# Patient Record
Sex: Female | Born: 1945 | Race: Black or African American | Hispanic: No | Marital: Married | Smoking: Never smoker
Health system: Southern US, Community
[De-identification: ages and names within clinical notes are randomized; demographics above are authoritative.]

## PROBLEM LIST (undated history)

## (undated) DIAGNOSIS — I10 Essential (primary) hypertension: Secondary | ICD-10-CM

## (undated) DIAGNOSIS — N390 Urinary tract infection, site not specified: Secondary | ICD-10-CM

## (undated) DIAGNOSIS — E7800 Pure hypercholesterolemia, unspecified: Secondary | ICD-10-CM

## (undated) DIAGNOSIS — E0590 Thyrotoxicosis, unspecified without thyrotoxic crisis or storm: Secondary | ICD-10-CM

## (undated) HISTORY — DX: Urinary tract infection, site not specified: N39.0

## (undated) HISTORY — PX: ABDOMINAL HYSTERECTOMY: SHX81

---

## 1973-10-24 HISTORY — PX: BREAST EXCISIONAL BIOPSY: SUR124

## 2003-10-25 HISTORY — PX: BREAST EXCISIONAL BIOPSY: SUR124

## 2004-09-24 ENCOUNTER — Ambulatory Visit: Payer: Self-pay | Admitting: General Surgery

## 2005-05-27 ENCOUNTER — Ambulatory Visit: Payer: Self-pay | Admitting: General Surgery

## 2007-06-29 ENCOUNTER — Ambulatory Visit: Payer: Self-pay | Admitting: Internal Medicine

## 2008-09-26 ENCOUNTER — Ambulatory Visit: Payer: Self-pay | Admitting: Internal Medicine

## 2009-08-07 ENCOUNTER — Ambulatory Visit: Payer: Self-pay | Admitting: Internal Medicine

## 2010-08-27 ENCOUNTER — Ambulatory Visit: Payer: Self-pay | Admitting: Internal Medicine

## 2011-10-11 ENCOUNTER — Ambulatory Visit: Payer: Self-pay | Admitting: Internal Medicine

## 2012-11-14 ENCOUNTER — Ambulatory Visit: Payer: Self-pay | Admitting: Internal Medicine

## 2014-01-27 ENCOUNTER — Ambulatory Visit: Payer: Self-pay | Admitting: Internal Medicine

## 2015-03-03 ENCOUNTER — Other Ambulatory Visit: Payer: Self-pay | Admitting: Internal Medicine

## 2015-03-03 DIAGNOSIS — Z1231 Encounter for screening mammogram for malignant neoplasm of breast: Secondary | ICD-10-CM

## 2015-03-11 ENCOUNTER — Ambulatory Visit
Admission: RE | Admit: 2015-03-11 | Discharge: 2015-03-11 | Disposition: A | Discharge status: Home / Self Care | Payer: 59 | Source: Ambulatory Visit | Attending: Internal Medicine | Admitting: Internal Medicine

## 2015-03-11 DIAGNOSIS — Z1231 Encounter for screening mammogram for malignant neoplasm of breast: Secondary | ICD-10-CM | POA: Diagnosis present

## 2016-03-14 ENCOUNTER — Other Ambulatory Visit: Payer: Self-pay | Admitting: Internal Medicine

## 2016-03-14 DIAGNOSIS — Z1231 Encounter for screening mammogram for malignant neoplasm of breast: Secondary | ICD-10-CM

## 2016-03-17 ENCOUNTER — Other Ambulatory Visit: Payer: Self-pay | Admitting: Internal Medicine

## 2016-03-17 ENCOUNTER — Ambulatory Visit
Admission: RE | Admit: 2016-03-17 | Discharge: 2016-03-17 | Disposition: A | Discharge status: Home / Self Care | Payer: 59 | Source: Ambulatory Visit | Attending: Internal Medicine | Admitting: Internal Medicine

## 2016-03-17 DIAGNOSIS — Z1231 Encounter for screening mammogram for malignant neoplasm of breast: Secondary | ICD-10-CM | POA: Insufficient documentation

## 2017-07-24 ENCOUNTER — Other Ambulatory Visit: Payer: Self-pay | Admitting: Internal Medicine

## 2017-07-24 DIAGNOSIS — Z1231 Encounter for screening mammogram for malignant neoplasm of breast: Secondary | ICD-10-CM

## 2017-08-03 ENCOUNTER — Ambulatory Visit
Admission: RE | Admit: 2017-08-03 | Discharge: 2017-08-03 | Disposition: A | Discharge status: Home / Self Care | Payer: Managed Care, Other (non HMO) | Source: Ambulatory Visit | Attending: Internal Medicine | Admitting: Internal Medicine

## 2017-08-03 DIAGNOSIS — Z1231 Encounter for screening mammogram for malignant neoplasm of breast: Secondary | ICD-10-CM

## 2018-03-23 ENCOUNTER — Other Ambulatory Visit: Payer: Self-pay | Admitting: Internal Medicine

## 2018-03-23 DIAGNOSIS — Z1231 Encounter for screening mammogram for malignant neoplasm of breast: Secondary | ICD-10-CM

## 2018-10-15 ENCOUNTER — Other Ambulatory Visit: Payer: Self-pay | Admitting: Internal Medicine

## 2018-10-15 DIAGNOSIS — Z1231 Encounter for screening mammogram for malignant neoplasm of breast: Secondary | ICD-10-CM

## 2018-10-22 ENCOUNTER — Ambulatory Visit
Admission: RE | Admit: 2018-10-22 | Discharge: 2018-10-22 | Disposition: A | Discharge status: Home / Self Care | Payer: Managed Care, Other (non HMO) | Source: Ambulatory Visit | Attending: Internal Medicine | Admitting: Internal Medicine

## 2018-10-22 DIAGNOSIS — Z1231 Encounter for screening mammogram for malignant neoplasm of breast: Secondary | ICD-10-CM | POA: Insufficient documentation

## 2019-01-10 ENCOUNTER — Encounter: Payer: Self-pay | Admitting: Emergency Medicine

## 2019-01-10 ENCOUNTER — Emergency Department
Admission: EM | Admit: 2019-01-10 | Discharge: 2019-01-10 | Disposition: A | Discharge status: Home / Self Care | Payer: No Typology Code available for payment source | Attending: Emergency Medicine | Admitting: Emergency Medicine

## 2019-01-10 ENCOUNTER — Other Ambulatory Visit: Payer: Self-pay

## 2019-01-10 DIAGNOSIS — Y9389 Activity, other specified: Secondary | ICD-10-CM | POA: Diagnosis not present

## 2019-01-10 DIAGNOSIS — S40022A Contusion of left upper arm, initial encounter: Secondary | ICD-10-CM | POA: Diagnosis not present

## 2019-01-10 DIAGNOSIS — S161XXA Strain of muscle, fascia and tendon at neck level, initial encounter: Secondary | ICD-10-CM | POA: Diagnosis not present

## 2019-01-10 DIAGNOSIS — S199XXA Unspecified injury of neck, initial encounter: Secondary | ICD-10-CM | POA: Diagnosis present

## 2019-01-10 DIAGNOSIS — Z79899 Other long term (current) drug therapy: Secondary | ICD-10-CM | POA: Diagnosis not present

## 2019-01-10 DIAGNOSIS — Y999 Unspecified external cause status: Secondary | ICD-10-CM | POA: Insufficient documentation

## 2019-01-10 DIAGNOSIS — Y92410 Unspecified street and highway as the place of occurrence of the external cause: Secondary | ICD-10-CM | POA: Insufficient documentation

## 2019-01-10 MED ORDER — KETOROLAC TROMETHAMINE 30 MG/ML IJ SOLN
15.0000 mg | Freq: Once | INTRAMUSCULAR | Status: AC
  Administered 2019-01-10: 15 mg via INTRAMUSCULAR
  Filled 2019-01-10: qty 1

## 2019-01-10 MED ORDER — METHOCARBAMOL 500 MG PO TABS: 500.0000 mg | ORAL_TABLET | Freq: Four times a day (QID) | ORAL | 0 refills | Status: DC

## 2019-01-10 MED ORDER — ORPHENADRINE CITRATE 30 MG/ML IJ SOLN
60.0000 mg | Freq: Once | INTRAMUSCULAR | Status: AC
  Administered 2019-01-10: 60 mg via INTRAMUSCULAR
  Filled 2019-01-10: qty 2

## 2019-01-10 MED ORDER — MELOXICAM 7.5 MG PO TABS: 7.5000 mg | ORAL_TABLET | Freq: Every day | ORAL | 0 refills | Status: DC
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
## 2019-08-02 ENCOUNTER — Other Ambulatory Visit: Payer: Self-pay

## 2019-08-02 DIAGNOSIS — Z20822 Contact with and (suspected) exposure to covid-19: Secondary | ICD-10-CM

## 2019-08-04 LAB — NOVEL CORONAVIRUS, NAA: SARS-CoV-2, NAA: NOT DETECTED

## 2019-08-19 ENCOUNTER — Other Ambulatory Visit: Payer: Self-pay | Admitting: Internal Medicine

## 2019-08-19 DIAGNOSIS — Z1231 Encounter for screening mammogram for malignant neoplasm of breast: Secondary | ICD-10-CM

## 2019-10-15 ENCOUNTER — Other Ambulatory Visit: Payer: Self-pay

## 2019-10-15 ENCOUNTER — Emergency Department
Admission: EM | Admit: 2019-10-15 | Discharge: 2019-10-15 | Disposition: A | Discharge status: Home / Self Care | Payer: Managed Care, Other (non HMO) | Attending: Emergency Medicine | Admitting: Emergency Medicine

## 2019-10-15 DIAGNOSIS — J0100 Acute maxillary sinusitis, unspecified: Secondary | ICD-10-CM | POA: Insufficient documentation

## 2019-10-15 DIAGNOSIS — U071 COVID-19: Secondary | ICD-10-CM | POA: Insufficient documentation

## 2019-10-15 DIAGNOSIS — Z79899 Other long term (current) drug therapy: Secondary | ICD-10-CM | POA: Insufficient documentation

## 2019-10-15 DIAGNOSIS — M7918 Myalgia, other site: Secondary | ICD-10-CM | POA: Insufficient documentation

## 2019-10-15 DIAGNOSIS — J111 Influenza due to unidentified influenza virus with other respiratory manifestations: Secondary | ICD-10-CM | POA: Diagnosis not present

## 2019-10-15 DIAGNOSIS — R519 Headache, unspecified: Secondary | ICD-10-CM | POA: Diagnosis present

## 2019-10-15 LAB — SARS CORONAVIRUS 2 (TAT 6-24 HRS): SARS Coronavirus 2: POSITIVE — AB

## 2019-10-15 MED ORDER — FEXOFENADINE-PSEUDOEPHED ER 60-120 MG PO TB12
1.0000 | ORAL_TABLET | Freq: Two times a day (BID) | ORAL | 0 refills | Status: AC
Start: 2019-10-15 — End: ?

## 2019-10-15 MED ORDER — AMOXICILLIN 125 MG/5ML PO SUSR: 125.0000 mg | Freq: Three times a day (TID) | ORAL | 0 refills | Status: DC

## 2019-10-15 MED ORDER — TRAMADOL HCL 50 MG PO TABS: 50.0000 mg | ORAL_TABLET | Freq: Two times a day (BID) | ORAL | 0 refills | Status: DC | PRN

## 2019-10-15 MED ORDER — KETOROLAC TROMETHAMINE 30 MG/ML IJ SOLN
30.0000 mg | Freq: Once | INTRAMUSCULAR | Status: AC
  Administered 2019-10-15: 08:00:00 30 mg via INTRAMUSCULAR
  Filled 2019-10-15: qty 1

## 2019-10-15 NOTE — ED Notes (Signed)
See triage note  Presents with some head congestion and h/a  States temp of 99 yesterday at home  Afebrile on arrival   States she has been around her grand child that tested positive for COVID
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
## 2019-10-15 NOTE — Discharge Instructions (Signed)
Advised self quarantine pending results of COVID-19 test.

## 2019-10-15 NOTE — ED Provider Notes (Signed)
[facility] Emergency Department Provider Note   ____________________________________________   First MD Initiated Contact with Patient 10/15/19 [phone]     (approximate)  I have reviewed the triage vital signs and the nursing notes.   HISTORY  Chief Complaint Headache and Generalized Body Aches    HPI Bethany Austin is a 73 y.o. female patient complaining headache generalized body aches sinus congestion and facial pain.  Onset of complaint beginning yesterday.  Patient has been exposed to Covid 19+ close family member.  Patient denies recent travel.  Patient has taken flu shot for this season.         History reviewed. No pertinent past medical history.  There are no problems to display for this patient.   Past Surgical History:  Procedure Laterality Date  . BREAST EXCISIONAL BIOPSY Right 1975   negative  . BREAST EXCISIONAL BIOPSY Right 2005   negative    Prior to Admission medications   Medication Sig Start Date End Date Taking? Authorizing Provider  amoxicillin (AMOXIL) 125 MG/5ML suspension Take 5 mLs (125 mg total) by mouth 3 (three) times daily. 10/15/19   Sable Feil, PA-C  atorvastatin (LIPITOR) 10 MG tablet Take 10 mg by mouth daily.    [provider]  fexofenadine-pseudoephedrine (ALLEGRA-D) 60-120 MG 12 hr tablet Take 1 tablet by mouth 2 (two) times daily. 10/15/19   Sable Feil, PA-C  levothyroxine (SYNTHROID, LEVOTHROID) 50 MCG tablet Take 50 mcg by mouth daily before breakfast.    [provider]  lisinopril-hydrochlorothiazide (PRINZIDE,ZESTORETIC) 10-12.5 MG tablet Take 1 tablet by mouth daily.    [provider]  traMADol (ULTRAM) 50 MG tablet Take 1 tablet (50 mg total) by mouth every 12 (twelve) hours as needed. 10/15/19   Sable Feil, PA-C    Allergies Patient has no known allergies.  Family History  Problem Relation Age of Onset  . Breast cancer Neg Hx     Social  History Social History   Tobacco Use  . Smoking status: Never Smoker  . Smokeless tobacco: Never Used  Substance Use Topics  . Alcohol use: Never  . Drug use: Never    Review of Systems Constitutional: No fever/chills Eyes: No visual changes. ENT: No sore throat.  Nasal congestion. Cardiovascular: Denies chest pain. Respiratory: Denies shortness of breath. Gastrointestinal: No abdominal pain.  No nausea, no vomiting.  No diarrhea.  No constipation. Genitourinary: Negative for dysuria. Musculoskeletal: Negative for back pain. Skin: Negative for rash. Neurological: N positive for headaches, but denies focal weakness or numbness. Endocrine:  Hyperlipidemia, hypertension, hypothyroidism.  ____________________________________________   PHYSICAL EXAM:  VITAL SIGNS: ED Triage Vitals  Enc Vitals Group     BP 10/15/19 0559 (!) 158/83     Pulse Rate 10/15/19 0559 (!) 106     Resp 10/15/19 0559 17     Temp 10/15/19 0559 98.3 F (36.8 C)     Temp Source 10/15/19 0559 Oral     SpO2 10/15/19 0559 100 %     Weight 10/15/19 0558 199 lb 15.3 oz (90.7 kg)     Height 10/15/19 0559 5\' 2"  (1.575 m)     Head Circumference --      Peak Flow --      Pain Score 10/15/19 0557 5     Pain Loc --      Pain Edu? --      Excl. in [place]? --     Constitutional: Alert and oriented. Well appearing  and in no acute distress. Eyes: Conjunctivae are normal. PERRL. EOMI. Head: Atraumatic. Nose: Edematous nasal turbinates with thick rhinorrhea. Mouth/Throat: Mucous membranes are moist.  Oropharynx non-erythematous. Neck: No stridor.  Hematological/Lymphatic/Immunilogical: No cervical lymphadenopathy. Cardiovascular: Normal rate, regular rhythm. Grossly normal heart sounds.  Good peripheral circulation. Respiratory: Normal respiratory effort.  No retractions. Lungs CTAB. Gastrointestinal: Soft and nontender. No distention. No abdominal bruits. No CVA tenderness. Neurologic:  Normal speech and language.  No gross focal neurologic deficits are appreciated. No gait instability. Skin:  Skin is warm, dry and intact. No rash noted. Psychiatric: Mood and affect are normal. Speech and behavior are normal.  ____________________________________________   LABS (all labs ordered are listed, but only abnormal results are displayed)  Labs Reviewed  SARS CORONAVIRUS 2 (TAT 6-24 HRS)   ____________________________________________  EKG   ____________________________________________  RADIOLOGY  ED MD interpretation:    Official radiology report(s): No results found.  ____________________________________________   PROCEDURES  Procedure(s) performed (including Critical Care):  Procedures   ____________________________________________   INITIAL IMPRESSION / ASSESSMENT AND PLAN / ED COURSE  As part of my medical decision making, I reviewed the following data within the [place]     Patient presents with generalized body aches, fatigue, and headache.  Patient also had complaint of sinus congestion and facial pain.  Physical exam is consistent with subacute maxillary sinusitis.  Patient given discharge care instruction advised self quarantine pending results of COVID-19 test.  [facility].   Bethany Austin was evaluated in Emergency Department on 10/15/2019 for the symptoms described in the history of present illness. She was evaluated in the context of the global COVID-19 pandemic, which necessitated consideration that the patient might be at risk for infection with the SARS-CoV-2 virus that causes COVID-19. Institutional protocols and algorithms that pertain to the evaluation of patients at risk for COVID-19 are in a state of rapid change based on information released by regulatory bodies including the CDC and federal and state organizations. These policies and algorithms were followed during the patient's care in the ED.        ____________________________________________   FINAL CLINICAL IMPRESSION(S) / ED DIAGNOSES  Final diagnoses:  Subacute maxillary sinusitis  Influenza-like illness     ED Discharge Orders         Ordered    amoxicillin (AMOXIL) 125 MG/5ML suspension  3 times daily     10/15/19 0746    fexofenadine-pseudoephedrine (ALLEGRA-D) 60-120 MG 12 hr tablet  2 times daily     10/15/19 0746    traMADol (ULTRAM) 50 MG tablet  Every 12 hours PRN     10/15/19 0746           Note:  This document was prepared using Dragon voice recognition software and may include unintentional dictation errors.    Sable Feil, PA-C 10/15/19 [id number]    Lavonia Drafts, MD 10/15/19 1016

## 2019-10-16 ENCOUNTER — Telehealth: Payer: Self-pay | Admitting: Unknown Physician Specialty

## 2019-10-16 ENCOUNTER — Telehealth: Payer: Self-pay | Admitting: Emergency Medicine

## 2019-10-16 NOTE — Telephone Encounter (Signed)
Called patient to inform of positive covid result.  Discussed isolation and quarantine guidelines.  She will notify her employer and she will call to reschedule mammogram and stress test.

## 2019-10-16 NOTE — Telephone Encounter (Signed)
Symptom onset yesterday.  Discussed with patient about Covid symptoms and the use of bamlanivimab, a monoclonal antibody infusion for those with mild to moderate Covid symptoms and at a high risk of hospitalization.  Pt is qualified for this infusion at the [facility] infusion center due to Age > 74   She will talk to husband and I will call back tomorrow

## 2019-10-17 ENCOUNTER — Telehealth: Payer: Self-pay | Admitting: Unknown Physician Specialty

## 2019-10-17 NOTE — Telephone Encounter (Signed)
Unable to leave message.  Mychart message left.

## 2019-10-17 NOTE — Telephone Encounter (Signed)
Patient called back and refused mab treatment for Covid 19

## 2019-10-30 ENCOUNTER — Ambulatory Visit: Payer: Medicare Other | Attending: Internal Medicine

## 2019-10-30 DIAGNOSIS — Z20822 Contact with and (suspected) exposure to covid-19: Secondary | ICD-10-CM

## 2019-10-31 LAB — NOVEL CORONAVIRUS, NAA: SARS-CoV-2, NAA: NOT DETECTED

## 2019-11-20 ENCOUNTER — Ambulatory Visit
Admission: RE | Admit: 2019-11-20 | Discharge: 2019-11-20 | Disposition: A | Discharge status: Home / Self Care | Payer: Managed Care, Other (non HMO) | Source: Ambulatory Visit | Attending: Internal Medicine | Admitting: Internal Medicine

## 2019-11-20 DIAGNOSIS — Z1231 Encounter for screening mammogram for malignant neoplasm of breast: Secondary | ICD-10-CM | POA: Insufficient documentation

## 2020-11-23 ENCOUNTER — Other Ambulatory Visit: Payer: Self-pay | Admitting: Internal Medicine

## 2020-11-23 DIAGNOSIS — Z1231 Encounter for screening mammogram for malignant neoplasm of breast: Secondary | ICD-10-CM

## 2020-12-03 ENCOUNTER — Telehealth (INDEPENDENT_AMBULATORY_CARE_PROVIDER_SITE_OTHER): Payer: Self-pay | Admitting: Gastroenterology

## 2020-12-03 ENCOUNTER — Other Ambulatory Visit: Payer: Self-pay

## 2020-12-03 DIAGNOSIS — Z1211 Encounter for screening for malignant neoplasm of colon: Secondary | ICD-10-CM

## 2020-12-03 MED ORDER — PEG 3350-KCL-NA BICARB-NACL 420 G PO SOLR: 4000.0000 mL | Freq: Once | ORAL | 0 refills | Status: AC

## 2020-12-03 NOTE — Progress Notes (Signed)
Gastroenterology Pre-Procedure Review  Request Date: 01/06/21 Requesting Physician: Dr. Bonna Gains  PATIENT REVIEW QUESTIONS: The patient responded to the following health history questions as indicated:    1. Are you having any GI issues? no 2. Do you have a personal history of Polyps? no 3. Do you have a family history of Colon Cancer or Polyps? no 4. Diabetes Mellitus? no 5. Joint replacements in the past 12 months?no 6. Major health problems in the past 3 months?no 7. Any artificial heart valves, MVP, or defibrillator?no    MEDICATIONS & ALLERGIES:    Patient reports the following regarding taking any anticoagulation/antiplatelet therapy:   Plavix, Coumadin, Eliquis, Xarelto, Lovenox, Pradaxa, Brilinta, or Effient? no Aspirin? yes (81 mg daily)  Patient confirms/reports the following medications:  Current Outpatient Medications  Medication Sig Dispense Refill  . atorvastatin (LIPITOR) 10 MG tablet Take 10 mg by mouth daily.    Marland Kitchen levothyroxine (SYNTHROID, LEVOTHROID) 50 MCG tablet Take 50 mcg by mouth daily before breakfast.    . lisinopril-hydrochlorothiazide (PRINZIDE,ZESTORETIC) 10-12.5 MG tablet Take 1 tablet by mouth daily.    . polyethylene glycol-electrolytes (NULYTELY) 420 g solution Take 4,000 mLs by mouth once for 1 dose. At 5pm fill nulytely bowel prep to the fill line with a clear liquid. Mix well.  Drink 8oz every 30 minutes until entire contents have been completed.  Do not eat or drink anything 4 hours prior to procedure. 4000 mL 0  . amoxicillin (AMOXIL) 125 MG/5ML suspension Take 5 mLs (125 mg total) by mouth 3 (three) times daily. (Patient not taking: Reported on 12/03/2020) 150 mL 0  . aspirin 81 MG chewable tablet Chew by mouth.    . fexofenadine-pseudoephedrine (ALLEGRA-D) 60-120 MG 12 hr tablet Take 1 tablet by mouth 2 (two) times daily. (Patient not taking: Reported on 12/03/2020) 20 tablet 0  . traMADol (ULTRAM) 50 MG tablet Take 1 tablet (50 mg total) by mouth  every 12 (twelve) hours as needed. (Patient not taking: Reported on 12/03/2020) 12 tablet 0   No current facility-administered medications for this visit.    Patient confirms/reports the following allergies:  No Known Allergies  No orders of the defined types were placed in this encounter.   AUTHORIZATION INFORMATION Primary Insurance: 1D#: Group #:  Secondary Insurance: 1D#: Group #:  SCHEDULE INFORMATION: Date: 01/06/21 Time: Location:[facility]

## 2021-01-04 ENCOUNTER — Other Ambulatory Visit: Payer: Self-pay

## 2021-01-04 ENCOUNTER — Other Ambulatory Visit
Admission: RE | Admit: 2021-01-04 | Discharge: 2021-01-04 | Disposition: A | Discharge status: Home / Self Care | Payer: Medicare Other | Source: Ambulatory Visit | Attending: Gastroenterology | Admitting: Gastroenterology

## 2021-01-04 DIAGNOSIS — Z20822 Contact with and (suspected) exposure to covid-19: Secondary | ICD-10-CM | POA: Insufficient documentation

## 2021-01-04 DIAGNOSIS — Z01812 Encounter for preprocedural laboratory examination: Secondary | ICD-10-CM | POA: Insufficient documentation

## 2021-01-04 LAB — SARS CORONAVIRUS 2 (TAT 6-24 HRS): SARS Coronavirus 2: NEGATIVE

## 2021-01-05 ENCOUNTER — Encounter: Payer: Self-pay | Admitting: Gastroenterology

## 2021-01-06 ENCOUNTER — Ambulatory Visit: Payer: Medicare Other | Admitting: Anesthesiology

## 2021-01-06 ENCOUNTER — Other Ambulatory Visit: Payer: Self-pay

## 2021-01-06 ENCOUNTER — Encounter: Payer: Self-pay | Admitting: Gastroenterology

## 2021-01-06 ENCOUNTER — Ambulatory Visit
Admission: RE | Admit: 2021-01-06 | Discharge: 2021-01-06 | Disposition: A | Discharge status: Home / Self Care | Payer: Medicare Other | Attending: Gastroenterology | Admitting: Gastroenterology

## 2021-01-06 ENCOUNTER — Encounter
Admission: RE | Disposition: A | Discharge status: Home / Self Care | Payer: Self-pay | Source: Home / Self Care | Attending: Gastroenterology

## 2021-01-06 DIAGNOSIS — K635 Polyp of colon: Secondary | ICD-10-CM | POA: Diagnosis not present

## 2021-01-06 DIAGNOSIS — Z79899 Other long term (current) drug therapy: Secondary | ICD-10-CM | POA: Insufficient documentation

## 2021-01-06 DIAGNOSIS — I10 Essential (primary) hypertension: Secondary | ICD-10-CM | POA: Diagnosis not present

## 2021-01-06 DIAGNOSIS — D125 Benign neoplasm of sigmoid colon: Secondary | ICD-10-CM | POA: Diagnosis not present

## 2021-01-06 DIAGNOSIS — Z1211 Encounter for screening for malignant neoplasm of colon: Secondary | ICD-10-CM | POA: Diagnosis present

## 2021-01-06 DIAGNOSIS — K648 Other hemorrhoids: Secondary | ICD-10-CM | POA: Insufficient documentation

## 2021-01-06 DIAGNOSIS — Z7982 Long term (current) use of aspirin: Secondary | ICD-10-CM | POA: Diagnosis not present

## 2021-01-06 HISTORY — PX: COLONOSCOPY WITH PROPOFOL: SHX5780

## 2021-01-06 HISTORY — DX: Essential (primary) hypertension: I10

## 2021-01-06 HISTORY — DX: Thyrotoxicosis, unspecified without thyrotoxic crisis or storm: E05.90

## 2021-01-06 HISTORY — DX: Pure hypercholesterolemia, unspecified: E78.00

## 2021-01-06 SURGERY — COLONOSCOPY WITH PROPOFOL
Anesthesia: General

## 2021-01-06 MED ORDER — PROPOFOL 500 MG/50ML IV EMUL
INTRAVENOUS | Status: AC
  Filled 2021-01-06: qty 50

## 2021-01-06 MED ORDER — SODIUM CHLORIDE 0.9 % IV SOLN: INTRAVENOUS | Status: DC

## 2021-01-06 MED ORDER — LIDOCAINE HCL (CARDIAC) PF 100 MG/5ML IV SOSY
PREFILLED_SYRINGE | INTRAVENOUS | Status: DC | PRN
  Administered 2021-01-06: 40 mg via INTRAVENOUS

## 2021-01-06 MED ORDER — PROPOFOL 500 MG/50ML IV EMUL
INTRAVENOUS | Status: DC | PRN
  Administered 2021-01-06: 150 ug/kg/min via INTRAVENOUS

## 2021-01-06 MED ORDER — PROPOFOL 10 MG/ML IV BOLUS
INTRAVENOUS | Status: DC | PRN
  Administered 2021-01-06: 70 mg via INTRAVENOUS

## 2021-01-06 NOTE — Transfer of Care (Signed)
Immediate Anesthesia Transfer of Care Note  Patient: Bethany Austin  Procedure(s) Performed: Procedure(s): COLONOSCOPY WITH PROPOFOL (N/A)  Patient Location: PACU and Endoscopy Unit  Anesthesia Type:General  Level of Consciousness: sedated  Airway & Oxygen Therapy: Patient Spontanous Breathing and Patient connected to nasal cannula oxygen  Post-op Assessment: Report given to RN and Post -op Vital signs reviewed and stable  Post vital signs: Reviewed and stable  Last Vitals:  Vitals:   01/06/21 0717 01/06/21 0833  BP: (!) 169/83 103/68  Pulse: 85   Resp: 18   Temp: (!) 35.8 C (!) 35.9 C  SpO2: 784%     Complications: No apparent anesthesia complications

## 2021-01-06 NOTE — Anesthesia Preprocedure Evaluation (Signed)
Anesthesia Evaluation  Patient identified by MRN, date of birth, ID band Patient awake    Reviewed: Allergy & Precautions, H&P , NPO status , Patient's Chart, lab work & pertinent test results, reviewed documented beta blocker date and time   Airway Mallampati: II   Neck ROM: full    Dental  (+) Poor Dentition   Pulmonary neg pulmonary ROS,    Pulmonary exam normal        Cardiovascular negative cardio ROS Normal cardiovascular exam Rhythm:regular Rate:Normal     Neuro/Psych negative neurological ROS  negative psych ROS   GI/Hepatic negative GI ROS, Neg liver ROS,   Endo/Other  negative endocrine ROS  Renal/GU negative Renal ROS  negative genitourinary   Musculoskeletal   Abdominal   Peds  Hematology negative hematology ROS (+)   Anesthesia Other Findings History reviewed. No pertinent past medical history. Past Surgical History: No date: ABDOMINAL HYSTERECTOMY 1975: BREAST EXCISIONAL BIOPSY; Right     Comment:  negative 2005: BREAST EXCISIONAL BIOPSY; Right     Comment:  negative   Reproductive/Obstetrics negative OB ROS                             Anesthesia Physical Anesthesia Plan  ASA: II  Anesthesia Plan: General   Post-op Pain Management:    Induction:   PONV Risk Score and Plan:   Airway Management Planned:   Additional Equipment:   Intra-op Plan:   Post-operative Plan:   Informed Consent: I have reviewed the patients History and Physical, chart, labs and discussed the procedure including the risks, benefits and alternatives for the proposed anesthesia with the patient or authorized representative who has indicated his/her understanding and acceptance.     Dental Advisory Given  Plan Discussed with: CRNA  Anesthesia Plan Comments:         Anesthesia Quick Evaluation

## 2021-01-06 NOTE — Op Note (Signed)
[facility] Gastroenterology Patient Name: Shalece Staffa Procedure Date: 01/06/2021 7:53 AM MRN: [id number] Account #: [id number] Date of Birth: [birth year] Admit Type: Outpatient Age: 75 Room: [facility] ENDO ROOM 3 Gender: Female Note Status: Finalized Procedure:             Colonoscopy Indications:           Screening for colorectal malignant neoplasm Providers:             Nanetta Wiegman B. Bonna Gains MD, MD Referring MD:          Forest Gleason Md, MD (Referring MD) Medicines:             Monitored Anesthesia Care Complications:         No immediate complications. Procedure:             Pre-Anesthesia Assessment:                        - ASA Grade Assessment: II - A patient with mild                         systemic disease.                        - Prior to the procedure, a History and Physical was                         performed, and patient medications, allergies and                         sensitivities were reviewed. The patient's tolerance                         of previous anesthesia was reviewed.                        - The risks and benefits of the procedure and the                         sedation options and risks were discussed with the                         patient. All questions were answered and informed                         consent was obtained.                        - Patient identification and proposed procedure were                         verified prior to the procedure by the physician, the                         nurse, the anesthesiologist, the anesthetist and the                         technician. The procedure was verified in the                         procedure  room.                        After obtaining informed consent, the colonoscope was                         passed under direct vision. Throughout the procedure,                         the patient's blood pressure, pulse, and oxygen                         saturations were  monitored continuously. The                         Colonoscope was introduced through the anus and                         advanced to the the cecum, identified by appendiceal                         orifice and ileocecal valve. The colonoscopy was                         performed with ease. The patient tolerated the                         procedure well. The quality of the bowel preparation                         was good. Findings:      The perianal and digital rectal examinations were normal.      A 6 mm polyp was found in the sigmoid colon. The polyp was sessile. The       polyp was removed with a cold snare. Resection and retrieval were       complete.      The exam was otherwise without abnormality.      The rectum, sigmoid colon, descending colon, transverse colon, ascending       colon and cecum appeared normal.      Non-bleeding internal hemorrhoids were found during retroflexion. Impression:            - One 6 mm polyp in the sigmoid colon, removed with a                         cold snare. Resected and retrieved.                        - The examination was otherwise normal.                        - The rectum, sigmoid colon, descending colon,                         transverse colon, ascending colon and cecum are normal.                        - Non-bleeding internal hemorrhoids. Recommendation:        - Discharge patient to home (with escort).                        -  Advance diet as tolerated.                        - Continue present medications.                        - Await pathology results.                        - Repeat colonoscopy date to be determined after                         pending pathology results are reviewed.                        - The findings and recommendations were discussed with                         the patient.                        - The findings and recommendations were discussed with                         the patient's family.                         - Return to primary care physician as previously                         scheduled.                        - High fiber diet. Procedure Code(s):     --- Professional ---                        [phone], Colonoscopy, flexible; with removal of                         tumor(s), polyp(s), or other lesion(s) by snare                         technique Diagnosis Code(s):     --- Professional ---                        Z12.11, Encounter for screening for malignant neoplasm                         of colon                        K63.5, Polyp of colon CPT copyright 2019 American Medical Association. All rights reserved. The codes documented in this report are preliminary and upon coder review may  be revised to meet current compliance requirements.  Vonda Antigua, MD Margretta Sidle B. Bonna Gains MD, MD 01/06/2021 10:36:15 AM This report has been signed electronically. Number of Addenda: 0 Note Initiated On: 01/06/2021 7:53 AM Scope Withdrawal Time: 0 hours 15 minutes 23 seconds  Total Procedure Duration: 0 hours 21 minutes 39 seconds  Estimated Blood Loss:  Estimated blood loss: none.      [facility]

## 2021-01-06 NOTE — Anesthesia Procedure Notes (Signed)
Date/Time: 01/06/2021 8:06 AM Performed by: Doreen Salvage, CRNA Pre-anesthesia Checklist: Patient identified, Emergency Drugs available, Suction available and Patient being monitored Patient Re-evaluated:Patient Re-evaluated prior to induction Oxygen Delivery Method: Nasal cannula Induction Type: IV induction Dental Injury: Teeth and Oropharynx as per pre-operative assessment  Comments: Nasal cannula with etCO2 monitoring

## 2021-01-06 NOTE — H&P (Signed)
Vonda Antigua, MD [address] [place], [place], [place], [address] [address], [place], [place], [place], [address] Phone: [phone]  Fax: [phone]  Primary Care Physician:  Casilda Carls, MD   Pre-Procedure History & Physical: HPI:  Bethany Austin is a 75 y.o. female is here for a colonoscopy.   Past Medical History:  Diagnosis Date  . Hypercholesteremia   . Hypertension   . Hyperthyroidism     Past Surgical History:  Procedure Laterality Date  . ABDOMINAL HYSTERECTOMY    . BREAST EXCISIONAL BIOPSY Right 1975   negative  . BREAST EXCISIONAL BIOPSY Right 2005   negative    Prior to Admission medications   Medication Sig Start Date End Date Taking? Authorizing Provider  acetaminophen (TYLENOL) 325 MG tablet Take 650 mg by mouth every 6 (six) hours as needed.   Yes [provider]  atorvastatin (LIPITOR) 10 MG tablet Take 10 mg by mouth daily.   Yes [provider]  fexofenadine-pseudoephedrine (ALLEGRA-D) 60-120 MG 12 hr tablet Take 1 tablet by mouth 2 (two) times daily. 10/15/19  Yes Sable Feil, PA-C  levothyroxine (SYNTHROID, LEVOTHROID) 50 MCG tablet Take 50 mcg by mouth daily before breakfast.   Yes [provider]  lisinopril-hydrochlorothiazide (PRINZIDE,ZESTORETIC) 10-12.5 MG tablet Take 1 tablet by mouth daily.   Yes [provider]  amoxicillin (AMOXIL) 125 MG/5ML suspension Take 5 mLs (125 mg total) by mouth 3 (three) times daily. Patient not taking: No sig reported 10/15/19   Sable Feil, PA-C  aspirin 81 MG chewable tablet Chew by mouth.    [provider]  traMADol (ULTRAM) 50 MG tablet Take 1 tablet (50 mg total) by mouth every 12 (twelve) hours as needed. Patient not taking: No sig reported 10/15/19   Sable Feil, PA-C    Allergies as of 12/04/2020  . (No Known Allergies)    Family History  Problem Relation Age of Onset  . Breast cancer Neg Hx     Social History    Socioeconomic History  . Marital status: Married    Spouse name: Not on file  . Number of children: Not on file  . Years of education: Not on file  . Highest education level: Not on file  Occupational History  . Not on file  Tobacco Use  . Smoking status: Never Smoker  . Smokeless tobacco: Never Used  Vaping Use  . Vaping Use: Never used  Substance and Sexual Activity  . Alcohol use: Never  . Drug use: Never  . Sexual activity: Not on file  Other Topics Concern  . Not on file  Social History Narrative  . Not on file   Social Determinants of Health   Financial Resource Strain: Not on file  Food Insecurity: Not on file  Transportation Needs: Not on file  Physical Activity: Not on file  Stress: Not on file  Social Connections: Not on file  Intimate Partner Violence: Not on file    Review of Systems: See HPI, otherwise negative ROS  Physical Exam: BP (!) 169/83   Pulse 85   Temp (!) 96.5 F (35.8 C) (Temporal)   Resp 18   Ht 5\' 2"  (1.575 m)   Wt 95.7 kg   SpO2 100%   BMI 38.59 kg/m  General:   Alert,  pleasant and cooperative in NAD Head:  Normocephalic and atraumatic. Neck:  Supple; no masses or thyromegaly. Lungs:  Clear throughout to auscultation, normal respiratory effort.  Heart:  +S1, +S2, Regular rate and rhythm, No edema. Abdomen:  Soft, nontender and nondistended. Normal bowel sounds, without guarding, and without rebound.   Neurologic:  Alert and  oriented x4;  grossly normal neurologically.  Impression/Plan: Bethany Austin is here for a colonoscopy to be performed for average risk screening.  Risks, benefits, limitations, and alternatives regarding  colonoscopy have been reviewed with the patient.  Questions have been answered.  All parties agreeable.   Virgel Manifold, MD  01/06/2021, 7:45 AM

## 2021-01-06 NOTE — Anesthesia Postprocedure Evaluation (Signed)
Anesthesia Post Note  Patient: Bethany Austin  Procedure(s) Performed: COLONOSCOPY WITH PROPOFOL (N/A )  Patient location during evaluation: PACU Anesthesia Type: General Level of consciousness: awake and alert Pain management: pain level controlled Vital Signs Assessment: post-procedure vital signs reviewed and stable Respiratory status: spontaneous breathing, nonlabored ventilation, respiratory function stable and patient connected to nasal cannula oxygen Cardiovascular status: blood pressure returned to baseline and stable Postop Assessment: no apparent nausea or vomiting Anesthetic complications: no   No complications documented.   Last Vitals:  Vitals:   01/06/21 0853 01/06/21 0903  BP: (!) 141/79 (!) 171/84  Pulse:    Resp: 16 16  Temp:    SpO2:      Last Pain:  Vitals:   01/06/21 0903  TempSrc:   PainSc: 0-No pain                 Molli Barrows

## 2021-01-07 ENCOUNTER — Encounter: Payer: Self-pay | Admitting: Gastroenterology

## 2021-01-07 LAB — SURGICAL PATHOLOGY

## 2021-01-12 ENCOUNTER — Encounter: Payer: Self-pay | Admitting: Gastroenterology

## 2021-03-04 ENCOUNTER — Ambulatory Visit
Admission: RE | Admit: 2021-03-04 | Discharge: 2021-03-04 | Disposition: A | Discharge status: Home / Self Care | Payer: Medicare Other | Source: Ambulatory Visit | Attending: Internal Medicine | Admitting: Internal Medicine

## 2021-03-04 ENCOUNTER — Other Ambulatory Visit: Payer: Self-pay

## 2021-03-04 DIAGNOSIS — Z1231 Encounter for screening mammogram for malignant neoplasm of breast: Secondary | ICD-10-CM | POA: Diagnosis present

## 2021-12-15 ENCOUNTER — Other Ambulatory Visit: Payer: Self-pay | Admitting: Internal Medicine

## 2021-12-15 DIAGNOSIS — Z1231 Encounter for screening mammogram for malignant neoplasm of breast: Secondary | ICD-10-CM

## 2022-03-10 ENCOUNTER — Ambulatory Visit
Admission: RE | Admit: 2022-03-10 | Discharge: 2022-03-10 | Disposition: A | Discharge status: Home / Self Care | Payer: Medicare Other | Source: Ambulatory Visit | Attending: Internal Medicine | Admitting: Internal Medicine

## 2022-03-10 DIAGNOSIS — Z1231 Encounter for screening mammogram for malignant neoplasm of breast: Secondary | ICD-10-CM | POA: Insufficient documentation

## 2022-06-30 DIAGNOSIS — N390 Urinary tract infection, site not specified: Secondary | ICD-10-CM | POA: Diagnosis not present

## 2022-07-04 DIAGNOSIS — B3731 Acute candidiasis of vulva and vagina: Secondary | ICD-10-CM | POA: Diagnosis not present

## 2022-09-20 DIAGNOSIS — E039 Hypothyroidism, unspecified: Secondary | ICD-10-CM | POA: Diagnosis not present

## 2022-09-20 DIAGNOSIS — E559 Vitamin D deficiency, unspecified: Secondary | ICD-10-CM | POA: Diagnosis not present

## 2022-09-20 DIAGNOSIS — I10 Essential (primary) hypertension: Secondary | ICD-10-CM | POA: Diagnosis not present

## 2022-09-20 DIAGNOSIS — R300 Dysuria: Secondary | ICD-10-CM | POA: Diagnosis not present

## 2022-09-20 DIAGNOSIS — R739 Hyperglycemia, unspecified: Secondary | ICD-10-CM | POA: Diagnosis not present

## 2022-09-20 DIAGNOSIS — M129 Arthropathy, unspecified: Secondary | ICD-10-CM | POA: Diagnosis not present

## 2022-09-20 DIAGNOSIS — E785 Hyperlipidemia, unspecified: Secondary | ICD-10-CM | POA: Diagnosis not present

## 2022-09-29 ENCOUNTER — Other Ambulatory Visit
Admission: RE | Admit: 2022-09-29 | Discharge: 2022-09-29 | Disposition: A | Discharge status: Home / Self Care | Payer: Medicare Other | Source: Ambulatory Visit | Attending: Internal Medicine | Admitting: Internal Medicine

## 2022-09-29 DIAGNOSIS — R739 Hyperglycemia, unspecified: Secondary | ICD-10-CM | POA: Insufficient documentation

## 2022-09-29 DIAGNOSIS — I10 Essential (primary) hypertension: Secondary | ICD-10-CM | POA: Insufficient documentation

## 2022-09-29 DIAGNOSIS — M129 Arthropathy, unspecified: Secondary | ICD-10-CM | POA: Diagnosis not present

## 2022-09-29 DIAGNOSIS — E785 Hyperlipidemia, unspecified: Secondary | ICD-10-CM | POA: Diagnosis not present

## 2022-09-29 DIAGNOSIS — E039 Hypothyroidism, unspecified: Secondary | ICD-10-CM | POA: Insufficient documentation

## 2022-09-29 DIAGNOSIS — E559 Vitamin D deficiency, unspecified: Secondary | ICD-10-CM | POA: Diagnosis not present

## 2022-09-29 DIAGNOSIS — R300 Dysuria: Secondary | ICD-10-CM | POA: Diagnosis not present

## 2022-09-29 LAB — URINALYSIS, COMPLETE (UACMP) WITH MICROSCOPIC
Bilirubin Urine: NEGATIVE
Glucose, UA: NEGATIVE mg/dL
Hgb urine dipstick: NEGATIVE
Ketones, ur: NEGATIVE mg/dL
Nitrite: NEGATIVE
Protein, ur: NEGATIVE mg/dL
Specific Gravity, Urine: 1.018 (ref 1.005–1.030)
pH: 5 (ref 5.0–8.0)

## 2022-09-30 LAB — URINE CULTURE: Culture: NO GROWTH

## 2022-10-03 DIAGNOSIS — I10 Essential (primary) hypertension: Secondary | ICD-10-CM | POA: Diagnosis not present

## 2022-10-03 DIAGNOSIS — N390 Urinary tract infection, site not specified: Secondary | ICD-10-CM | POA: Diagnosis not present

## 2022-12-15 DIAGNOSIS — E119 Type 2 diabetes mellitus without complications: Secondary | ICD-10-CM | POA: Diagnosis not present

## 2022-12-15 DIAGNOSIS — Z0000 Encounter for general adult medical examination without abnormal findings: Secondary | ICD-10-CM | POA: Diagnosis not present

## 2022-12-15 DIAGNOSIS — E039 Hypothyroidism, unspecified: Secondary | ICD-10-CM | POA: Diagnosis not present

## 2022-12-15 DIAGNOSIS — E785 Hyperlipidemia, unspecified: Secondary | ICD-10-CM | POA: Diagnosis not present

## 2022-12-15 DIAGNOSIS — E559 Vitamin D deficiency, unspecified: Secondary | ICD-10-CM | POA: Diagnosis not present

## 2022-12-15 DIAGNOSIS — I10 Essential (primary) hypertension: Secondary | ICD-10-CM | POA: Diagnosis not present

## 2022-12-15 DIAGNOSIS — R5383 Other fatigue: Secondary | ICD-10-CM | POA: Diagnosis not present

## 2022-12-15 DIAGNOSIS — T7840XD Allergy, unspecified, subsequent encounter: Secondary | ICD-10-CM | POA: Diagnosis not present

## 2022-12-16 DIAGNOSIS — E559 Vitamin D deficiency, unspecified: Secondary | ICD-10-CM | POA: Diagnosis not present

## 2022-12-16 DIAGNOSIS — E785 Hyperlipidemia, unspecified: Secondary | ICD-10-CM | POA: Diagnosis not present

## 2022-12-16 DIAGNOSIS — I10 Essential (primary) hypertension: Secondary | ICD-10-CM | POA: Diagnosis not present

## 2022-12-16 DIAGNOSIS — R5383 Other fatigue: Secondary | ICD-10-CM | POA: Diagnosis not present

## 2022-12-16 DIAGNOSIS — E119 Type 2 diabetes mellitus without complications: Secondary | ICD-10-CM | POA: Diagnosis not present

## 2022-12-16 DIAGNOSIS — E039 Hypothyroidism, unspecified: Secondary | ICD-10-CM | POA: Diagnosis not present

## 2022-12-20 ENCOUNTER — Other Ambulatory Visit: Payer: Self-pay | Admitting: Internal Medicine

## 2022-12-20 DIAGNOSIS — Z1231 Encounter for screening mammogram for malignant neoplasm of breast: Secondary | ICD-10-CM

## 2023-03-21 DIAGNOSIS — E785 Hyperlipidemia, unspecified: Secondary | ICD-10-CM | POA: Diagnosis not present

## 2023-03-21 DIAGNOSIS — N390 Urinary tract infection, site not specified: Secondary | ICD-10-CM | POA: Diagnosis not present

## 2023-03-21 DIAGNOSIS — E119 Type 2 diabetes mellitus without complications: Secondary | ICD-10-CM | POA: Diagnosis not present

## 2023-03-21 DIAGNOSIS — M129 Arthropathy, unspecified: Secondary | ICD-10-CM | POA: Diagnosis not present

## 2023-03-21 DIAGNOSIS — I10 Essential (primary) hypertension: Secondary | ICD-10-CM | POA: Diagnosis not present

## 2023-03-21 DIAGNOSIS — R300 Dysuria: Secondary | ICD-10-CM | POA: Diagnosis not present

## 2023-03-21 DIAGNOSIS — E039 Hypothyroidism, unspecified: Secondary | ICD-10-CM | POA: Diagnosis not present

## 2023-04-04 DIAGNOSIS — N390 Urinary tract infection, site not specified: Secondary | ICD-10-CM | POA: Diagnosis not present

## 2023-04-21 DIAGNOSIS — N390 Urinary tract infection, site not specified: Secondary | ICD-10-CM | POA: Diagnosis not present

## 2023-04-28 DIAGNOSIS — N390 Urinary tract infection, site not specified: Secondary | ICD-10-CM | POA: Diagnosis not present

## 2023-05-08 DIAGNOSIS — N390 Urinary tract infection, site not specified: Secondary | ICD-10-CM | POA: Diagnosis not present

## 2023-05-08 DIAGNOSIS — E039 Hypothyroidism, unspecified: Secondary | ICD-10-CM | POA: Diagnosis not present

## 2023-05-10 ENCOUNTER — Ambulatory Visit
Admission: RE | Admit: 2023-05-10 | Discharge: 2023-05-10 | Disposition: A | Discharge status: Home / Self Care | Payer: Medicare Other | Source: Ambulatory Visit | Attending: Internal Medicine | Admitting: Internal Medicine

## 2023-05-10 ENCOUNTER — Ambulatory Visit
Admission: RE | Admit: 2023-05-10 | Discharge: 2023-05-10 | Disposition: A | Discharge status: Home / Self Care | Payer: Medicare Other | Attending: Urology | Admitting: Urology

## 2023-05-10 ENCOUNTER — Encounter: Payer: Self-pay | Admitting: Urology

## 2023-05-10 ENCOUNTER — Ambulatory Visit
Admission: RE | Admit: 2023-05-10 | Discharge: 2023-05-10 | Disposition: A | Discharge status: Home / Self Care | Payer: Medicare Other | Source: Ambulatory Visit | Attending: Urology | Admitting: Urology

## 2023-05-10 ENCOUNTER — Ambulatory Visit: Payer: Medicare Other | Admitting: Urology

## 2023-05-10 VITALS — BP 154/79 | HR 81 | Ht 62.0 in | Wt 211.0 lb

## 2023-05-10 DIAGNOSIS — Z1231 Encounter for screening mammogram for malignant neoplasm of breast: Secondary | ICD-10-CM | POA: Diagnosis not present

## 2023-05-10 DIAGNOSIS — N390 Urinary tract infection, site not specified: Secondary | ICD-10-CM

## 2023-05-10 DIAGNOSIS — N200 Calculus of kidney: Secondary | ICD-10-CM | POA: Diagnosis not present

## 2023-05-10 DIAGNOSIS — Z09 Encounter for follow-up examination after completed treatment for conditions other than malignant neoplasm: Secondary | ICD-10-CM

## 2023-05-10 DIAGNOSIS — Z87440 Personal history of urinary (tract) infections: Secondary | ICD-10-CM

## 2023-05-10 MED ORDER — SULFAMETHOXAZOLE-TRIMETHOPRIM 800-160 MG PO TABS: 1.0000 | ORAL_TABLET | Freq: Two times a day (BID) | ORAL | 0 refills | Status: DC

## 2023-05-10 MED ORDER — ESTRADIOL 0.1 MG/GM VA CREA: TOPICAL_CREAM | VAGINAL | 12 refills | Status: DC

## 2023-05-10 MED ORDER — TRIMETHOPRIM 100 MG PO TABS: 100.0000 mg | ORAL_TABLET | Freq: Every day | ORAL | 0 refills | Status: DC

## 2023-05-10 NOTE — Progress Notes (Signed)
   05/10/23 2:09 PM   Bethany Austin [birth year] [id number]  CC: Recurrent UTI  HPI: Bethany Austin was referred for recurrent UTI.  Minimal outside records are available to me.  She unfortunately recently lost her husband in the last 2 months.  She reports a few months of intermittent back pain and dysuria, urine culture on 03/21/2023 showed Klebsiella, this was resistant to Augmentin and intermediate sensitivity to nitrofurantoin.  She is unsure what antibiotic she took at that time.  Repeat urinalysis on 04/28/2023 was contaminated with greater than 10 epithelial cells, showed greater than 30 WBC, 3-10 RBC, many bacteria, culture showed only mixed flora.  She currently is on nitrofurantoin, but has had bothersome nausea and headache on that medication.  She denies any gross hematuria or flank pain.  No prior cross-sectional imaging to review.   PMH: Past Medical History:  Diagnosis Date   Hypercholesteremia    Hypertension    Hyperthyroidism    Urinary tract infection     Surgical History: Past Surgical History:  Procedure Laterality Date   ABDOMINAL HYSTERECTOMY     BREAST EXCISIONAL BIOPSY Right 1975   negative   BREAST EXCISIONAL BIOPSY Right 2005   negative   COLONOSCOPY WITH PROPOFOL N/A 01/06/2021   Procedure: COLONOSCOPY WITH PROPOFOL;  Surgeon: Pasty Spillers, MD;  Location: [facility] ENDOSCOPY;  Service: Endoscopy;  Laterality: N/A;    Family History: Family History  Problem Relation Age of Onset   Breast cancer Neg Hx     Social History:  reports that she has never smoked. She has never used smokeless tobacco. She reports that she does not drink alcohol and does not use drugs.  Physical Exam: BP (!) 154/79 (BP Location: Left Arm, Patient Position: Sitting, Cuff Size: Large)   Pulse 81   Ht 5\' 2"  (1.575 m)   Wt 211 lb (95.7 kg)   BMI 38.59 kg/m    Constitutional:  Alert and oriented, No acute distress. Cardiovascular: No clubbing, cyanosis, or  edema. Respiratory: Normal respiratory effort, no increased work of breathing. GI: Abdomen is soft, nontender, nondistended, no abdominal masses  Assessment & Plan:   77 year old female with single documented culture that I have access to from May 2024 showing Klebsiella, most recent culture showed only mixed flora and was contaminated with skin cells.  She denies any urinary symptoms today.  We discussed the evaluation and treatment of patients with recurrent UTIs at length.  We specifically discussed the differences between asymptomatic bacteriuria and true urinary tract infection.  We discussed the AUA definition of recurrent UTI of at least 2 culture proven symptomatic acute cystitis episodes in a 84-month period, or 3 within a 1 year period.  We discussed the importance of culture directed antibiotic treatment, and antibiotic stewardship.  First-line therapy includes nitrofurantoin(5 days), Bactrim(3 days), or fosfomycin(3 g single dose).  Possible etiologies of recurrent infection include periurethral tissue atrophy in postmenopausal woman, constipation, sexual activity, incomplete emptying, anatomic abnormalities, and even genetic predisposition.  Finally, we discussed the role of perineal hygiene, timed voiding, adequate hydration, topical vaginal estrogen, cranberry prophylaxis, and low-dose antibiotic prophylaxis.  Topical estrogen cream, trimethoprim prophylaxis, KUB today to evaluate for stone disease   Legrand Rams, MD 05/10/2023  [facility] Urology [address], [address] [place], [place] [address] [phone]

## 2023-05-16 ENCOUNTER — Ambulatory Visit: Payer: Medicare Other | Admitting: Licensed Practical Nurse

## 2023-05-16 ENCOUNTER — Other Ambulatory Visit (HOSPITAL_COMMUNITY)
Admission: RE | Admit: 2023-05-16 | Discharge: 2023-05-16 | Disposition: A | Discharge status: Home / Self Care | Payer: Medicare Other | Source: Ambulatory Visit | Attending: Licensed Practical Nurse | Admitting: Licensed Practical Nurse

## 2023-05-16 ENCOUNTER — Encounter: Payer: Self-pay | Admitting: Licensed Practical Nurse

## 2023-05-16 VITALS — BP 137/53 | HR 93 | Wt 210.0 lb

## 2023-05-16 DIAGNOSIS — N949 Unspecified condition associated with female genital organs and menstrual cycle: Secondary | ICD-10-CM | POA: Diagnosis present

## 2023-05-16 DIAGNOSIS — N898 Other specified noninflammatory disorders of vagina: Secondary | ICD-10-CM | POA: Diagnosis not present

## 2023-05-16 NOTE — Progress Notes (Signed)
  HPI:      Ms. Bethany Austin is a 77 y.o. No obstetric history on file. who LMP was No LMP recorded. Patient has had a hysterectomy., presents today for a problem visit.  She complains WN:[id number] burning.   Bethany Austin was recently seen and treated by a Urologist for frequent UTI's, treatment included Estrace cream, Bactrim and Trimpex. She is here today because she experiences vaginal burning, it seems to happen while urinating. She wonders if she has  a yeast infection from the antibiotics she has been on. Has not noted any discharge. She has not started the estrace cream because there is a warning about cancer on the box.  Bethany Austin mentioned that she lost her husband of 56 years about 5 weeks ago, admits to feeling sad at night and tends to cry. She stays busy during the day and goes to an exercise group regularly. She has three grown children and many grand and great grandchildren  nearby. She has information from hospice for grief counseling.   Pt had a hysterectomy Gets mammograms regularly, last done 05/10/2023    PMHx: She  has a past medical history of Hypercholesteremia, Hypertension, Hyperthyroidism, and Urinary tract infection. Also,  has a past surgical history that includes Abdominal hysterectomy; Colonoscopy with propofol (N/A, 01/06/2021); Breast excisional biopsy (Right, 1975); and Breast excisional biopsy (Right, 2005)., family history is not on file.,  reports that she has never smoked. She has never used smokeless tobacco. She reports that she does not drink alcohol and does not use drugs.  She has a current medication list which includes the following prescription(s): acetaminophen, aspirin, atorvastatin, estradiol, fexofenadine-pseudoephedrine, levothyroxine, lisinopril-hydrochlorothiazide, sulfamethoxazole-trimethoprim, and trimethoprim. Also, has No Known Allergies.  ROSsee HPI   Objective: BP (!) 137/53   Pulse 93   Wt 210 lb (95.3 kg)   BMI 38.41 kg/m  Physical  Exam Constitutional:      Appearance: Normal appearance.  Genitourinary:     Vulva normal.     Genitourinary Comments: Vaginal atrophy noted, consistent with post menopausal state.  Urethra a little prominent No obvious discharge or discoloration of skin to suggest yeast. Aptima swab blindly collected.    Pulmonary:     Effort: Pulmonary effort is normal.  Neurological:     Mental Status: She is alert.  Skin:    General: Skin is warm.  Psychiatric:        Mood and Affect: Mood normal.     ASSESSMENT/PLAN:    Problem List Items Addressed This Visit   None Visit Diagnoses     Vaginal burning    -  Primary   Relevant Orders   Cervicovaginal ancillary only       -Rec using estrace cream as prescribed, this can help with the symptom of burning. This is a safe medication to use. You may apply a small amount on your fingertip rather than using the applicator if you prefer.   -aptima swab sent, will treat based on results  -pr has fu with urology in November  -reminded pt to seek out counseling if her grief gets to be too much.   Carie Caddy, CNM   [facility]  05/16/23  4:47 PM

## 2023-05-18 LAB — CERVICOVAGINAL ANCILLARY ONLY
Bacterial Vaginitis (gardnerella): POSITIVE — AB
Candida Vaginitis: NEGATIVE
Comment: NEGATIVE
Comment: NEGATIVE

## 2023-05-22 ENCOUNTER — Telehealth: Payer: Self-pay

## 2023-05-22 NOTE — Telephone Encounter (Signed)
Called pt no answer. Unable to leave message as voicemail has not been set up. Mychart message sent.

## 2023-05-22 NOTE — Telephone Encounter (Signed)
-----   Message from Sondra Come sent at 05/22/2023  7:44 AM EDT ----- Regarding: KUB results Her x-ray does show a possible nonobstructing stone on the right side.  I still would recommend continuing the topical estrogen cream, but please change her follow-up to be with me in November, thanks.  We would only need to remove the stone if she continues to have infections despite topical estrogen cream  Legrand Rams, MD 05/22/2023 ----- Message ----- From: Interface, Rad Results In Sent: 05/19/2023  10:13 PM EDT To: Sondra Come, MD

## 2023-05-23 ENCOUNTER — Other Ambulatory Visit: Payer: Self-pay | Admitting: Licensed Practical Nurse

## 2023-05-23 DIAGNOSIS — N760 Acute vaginitis: Secondary | ICD-10-CM

## 2023-05-24 ENCOUNTER — Encounter: Payer: Self-pay | Admitting: Licensed Practical Nurse

## 2023-05-24 NOTE — Telephone Encounter (Signed)
Pt called again; returned call to pt; pt aware ov BV and LMD needs to send in an antibiotic for her but needs to know what she is already on; pt states she is on Trimethorim.  Pt aware to keep taking this as well as what LMD send in.  Pt states antibxs give her a h/a and yeast inf; adv to take e.s. tylenol if not allergic for h/a and monistat for yeast inf.

## 2023-05-24 NOTE — Telephone Encounter (Signed)
Pt calling for her results of the swab; does she need medication?  [phone]  Tried calling to see if she is taking any antibx but couldn't even leave a msg.

## 2023-05-25 ENCOUNTER — Other Ambulatory Visit: Payer: Self-pay

## 2023-05-25 DIAGNOSIS — B9689 Other specified bacterial agents as the cause of diseases classified elsewhere: Secondary | ICD-10-CM

## 2023-05-25 MED ORDER — METRONIDAZOLE 500 MG PO TABS: 500.0000 mg | ORAL_TABLET | Freq: Two times a day (BID) | ORAL | 0 refills | Status: DC

## 2023-05-25 NOTE — Progress Notes (Signed)
Pt called triage her metronidazole was not sent to the pharmacy, I sent to [facility] pt aware take with food and no Alcohol while taking this medicine.

## 2023-05-26 MED ORDER — METRONIDAZOLE 500 MG PO TABS: 500.0000 mg | ORAL_TABLET | Freq: Two times a day (BID) | ORAL | 0 refills | Status: DC

## 2023-05-26 NOTE — Progress Notes (Signed)
Pt on trimethoprim for UTI suppression, confirmed with pharmacist at [facility], ok to treat BV with Flagyl Script sent to  pharmacy. Carie Caddy, CNM  [facility]  05/26/23  2:09 PM

## 2023-06-14 DIAGNOSIS — E785 Hyperlipidemia, unspecified: Secondary | ICD-10-CM | POA: Diagnosis not present

## 2023-06-14 DIAGNOSIS — I10 Essential (primary) hypertension: Secondary | ICD-10-CM | POA: Diagnosis not present

## 2023-06-14 DIAGNOSIS — M129 Arthropathy, unspecified: Secondary | ICD-10-CM | POA: Diagnosis not present

## 2023-06-14 DIAGNOSIS — E559 Vitamin D deficiency, unspecified: Secondary | ICD-10-CM | POA: Diagnosis not present

## 2023-06-14 DIAGNOSIS — T7840XD Allergy, unspecified, subsequent encounter: Secondary | ICD-10-CM | POA: Diagnosis not present

## 2023-06-14 DIAGNOSIS — E119 Type 2 diabetes mellitus without complications: Secondary | ICD-10-CM | POA: Diagnosis not present

## 2023-06-14 DIAGNOSIS — E039 Hypothyroidism, unspecified: Secondary | ICD-10-CM | POA: Diagnosis not present

## 2023-09-11 ENCOUNTER — Ambulatory Visit: Payer: Medicare Other | Admitting: Urology

## 2023-09-12 ENCOUNTER — Ambulatory Visit: Payer: Medicare Other | Admitting: Urology

## 2023-10-20 ENCOUNTER — Ambulatory Visit: Payer: Medicare Other | Admitting: Urology

## 2023-10-23 ENCOUNTER — Ambulatory Visit: Payer: Medicare Other | Admitting: Physician Assistant

## 2023-10-23 VITALS — BP 155/82 | HR 96 | Ht 62.0 in | Wt 214.1 lb

## 2023-10-23 DIAGNOSIS — R82998 Other abnormal findings in urine: Secondary | ICD-10-CM | POA: Diagnosis not present

## 2023-10-23 DIAGNOSIS — N200 Calculus of kidney: Secondary | ICD-10-CM | POA: Diagnosis not present

## 2023-10-23 DIAGNOSIS — N390 Urinary tract infection, site not specified: Secondary | ICD-10-CM

## 2023-10-23 DIAGNOSIS — R350 Frequency of micturition: Secondary | ICD-10-CM | POA: Diagnosis not present

## 2023-10-23 MED ORDER — SULFAMETHOXAZOLE-TRIMETHOPRIM 800-160 MG PO TABS: 1.0000 | ORAL_TABLET | Freq: Two times a day (BID) | ORAL | 0 refills | Status: AC

## 2023-10-24 LAB — MICROSCOPIC EXAMINATION: WBC, UA: >30 /[HPF] — AB (ref 0–5)

## 2023-10-24 LAB — URINALYSIS, COMPLETE
Bilirubin, UA: NEGATIVE
Glucose, UA: NEGATIVE
Ketones, UA: NEGATIVE
Nitrite, UA: NEGATIVE
Protein,UA: NEGATIVE
Specific Gravity, UA: 1.01 (ref 1.005–1.030)
Urobilinogen, Ur: 0.2 mg/dL (ref 0.2–1.0)
pH, UA: 5.5 (ref 5.0–7.5)

## 2023-10-26 LAB — CULTURE, URINE COMPREHENSIVE

## 2023-11-06 ENCOUNTER — Telehealth: Payer: Self-pay | Admitting: Physician Assistant

## 2023-11-06 ENCOUNTER — Ambulatory Visit
Admission: RE | Admit: 2023-11-06 | Discharge: 2023-11-06 | Disposition: A | Discharge status: Home / Self Care | Payer: Medicare Other | Source: Ambulatory Visit | Attending: Physician Assistant | Admitting: Physician Assistant

## 2023-11-06 DIAGNOSIS — N200 Calculus of kidney: Secondary | ICD-10-CM | POA: Diagnosis not present

## 2023-11-06 DIAGNOSIS — K429 Umbilical hernia without obstruction or gangrene: Secondary | ICD-10-CM | POA: Diagnosis not present

## 2023-11-06 DIAGNOSIS — N281 Cyst of kidney, acquired: Secondary | ICD-10-CM | POA: Diagnosis not present

## 2023-11-06 IMAGING — MG MM DIGITAL SCREENING BILAT W/ TOMO AND CAD
6 of 10 series · 6 of 30 positions shown · non-contrast
Comparison: Previous exam(s).

CLINICAL DATA: Screening.

EXAM:
DIGITAL SCREENING BILATERAL MAMMOGRAM WITH TOMOSYNTHESIS AND CAD
TECHNIQUE: Bilateral screening digital craniocaudal and mediolateral oblique
mammograms were obtained. Bilateral screening digital breast
tomosynthesis was performed. The images were evaluated with
computer-aided detection.

[L MLO synth-2D (1 of 2)]
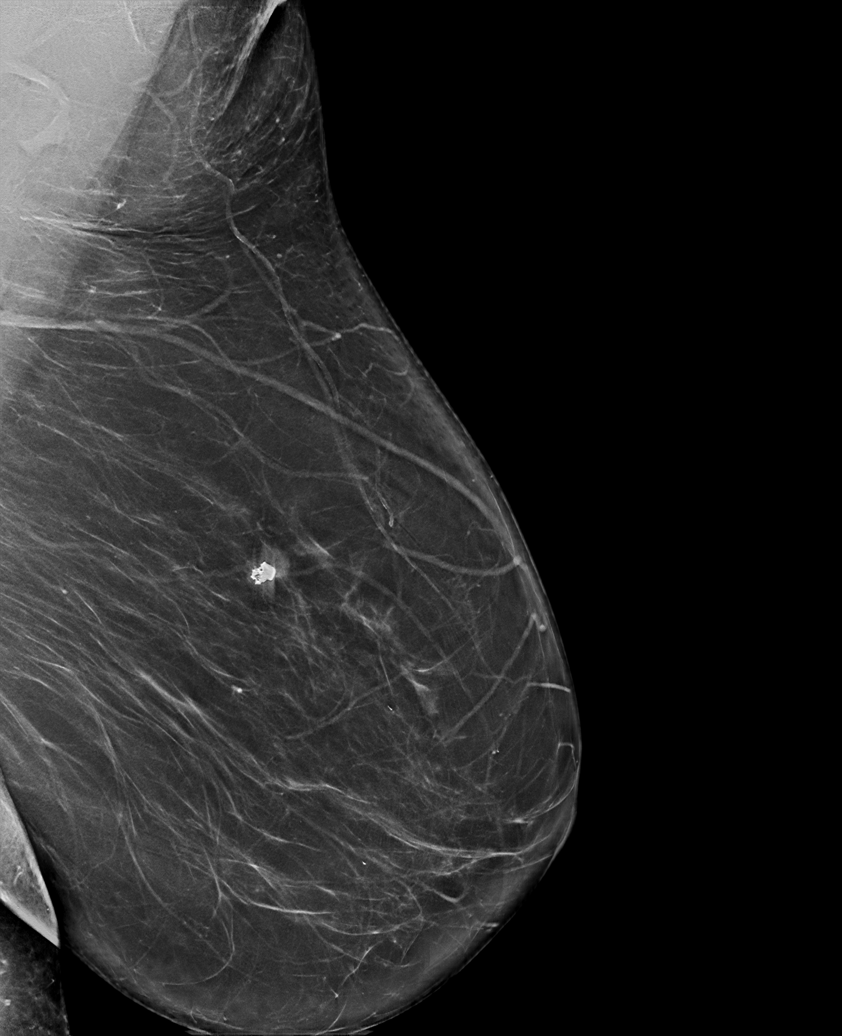

[R CC synth-2D]
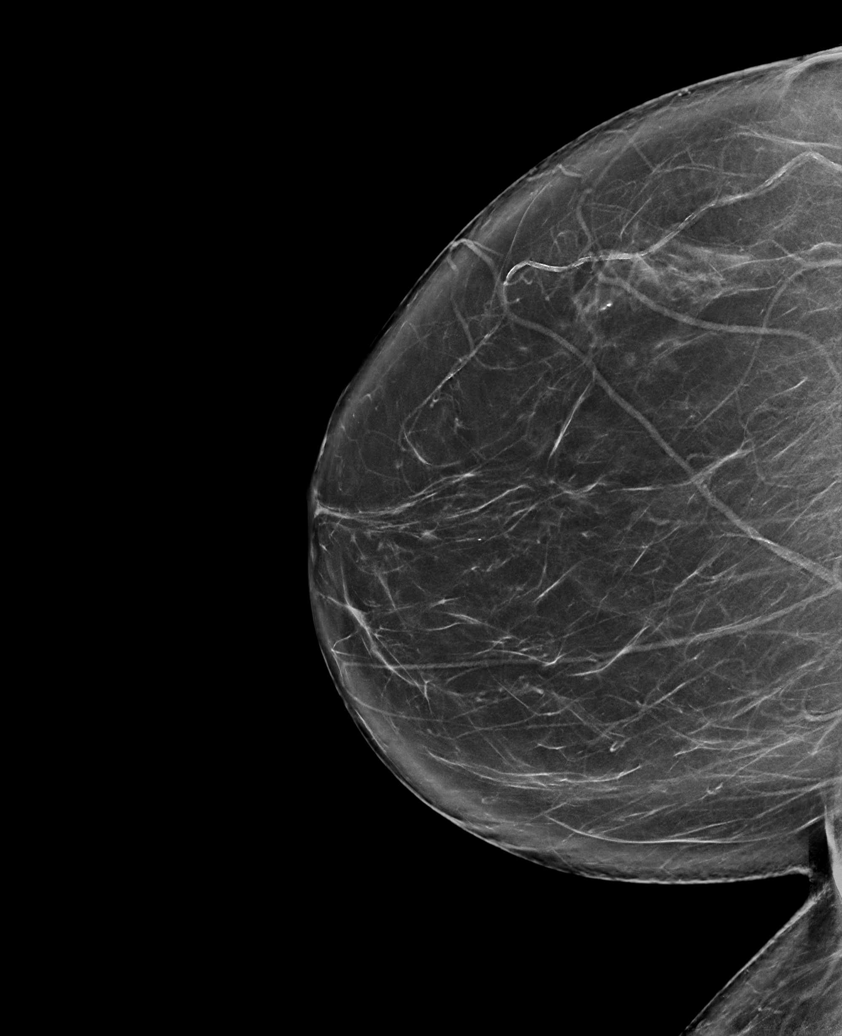

[L CC synth-2D]
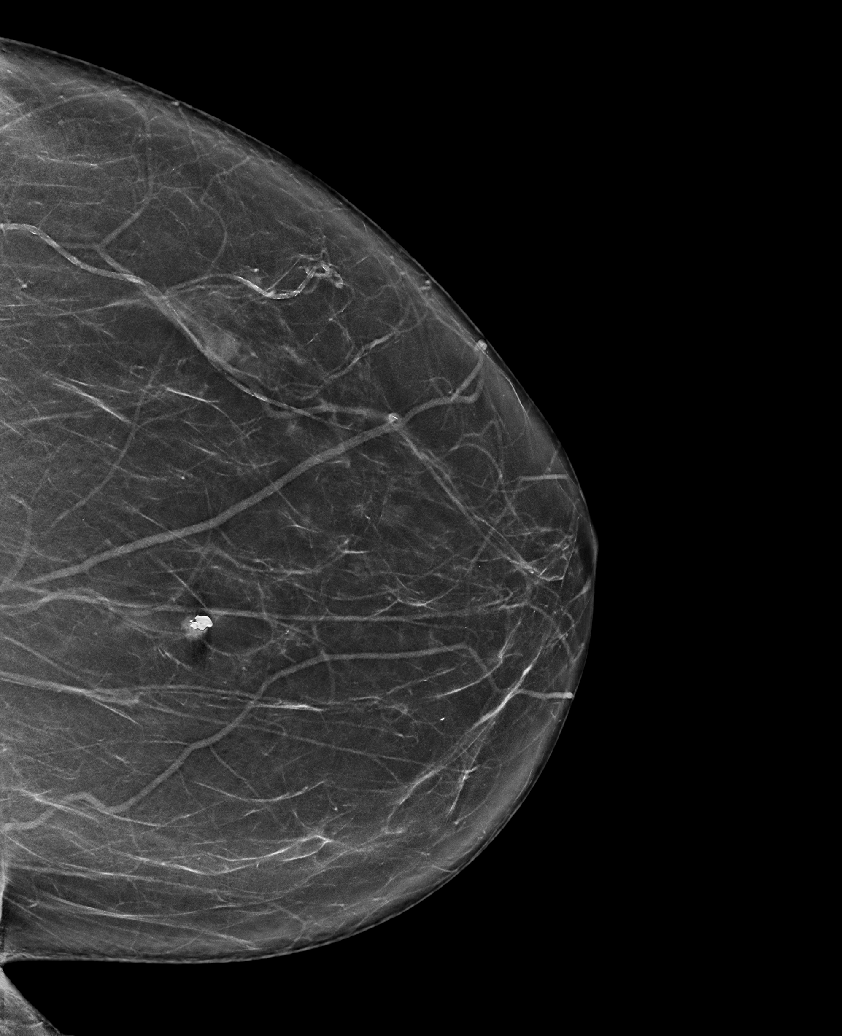

[L MLO synth-2D (2 of 2)]
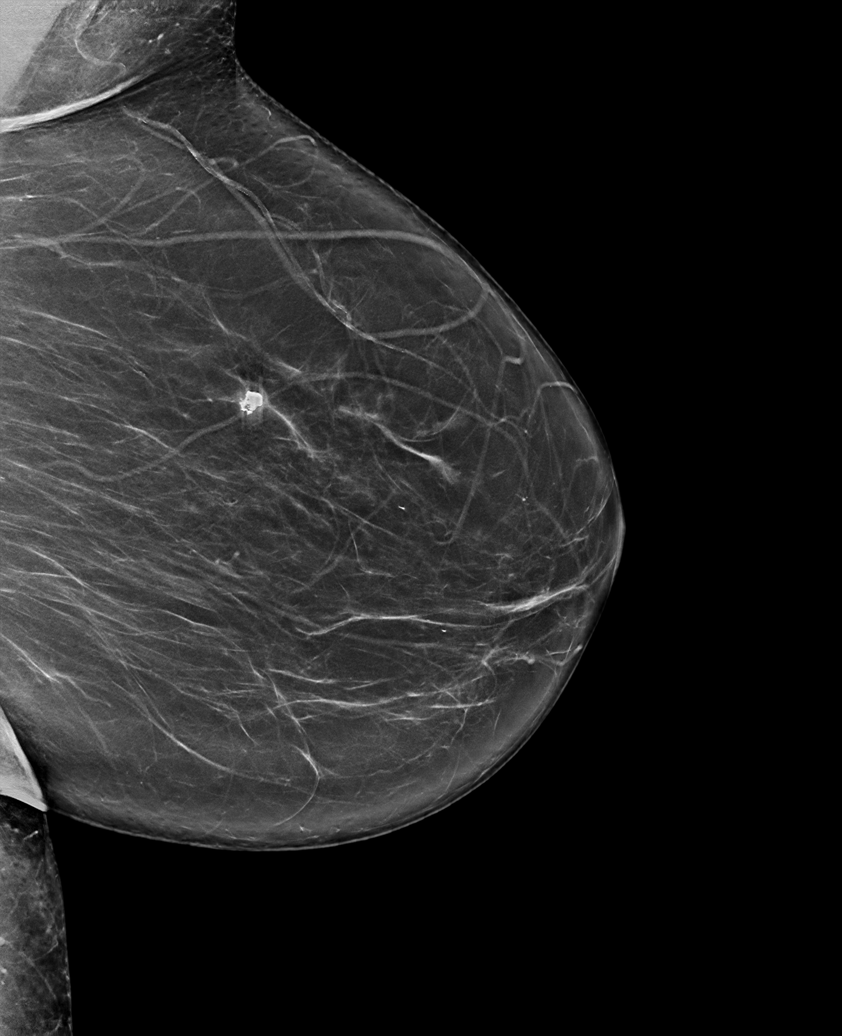

[R MLO synth-2D]
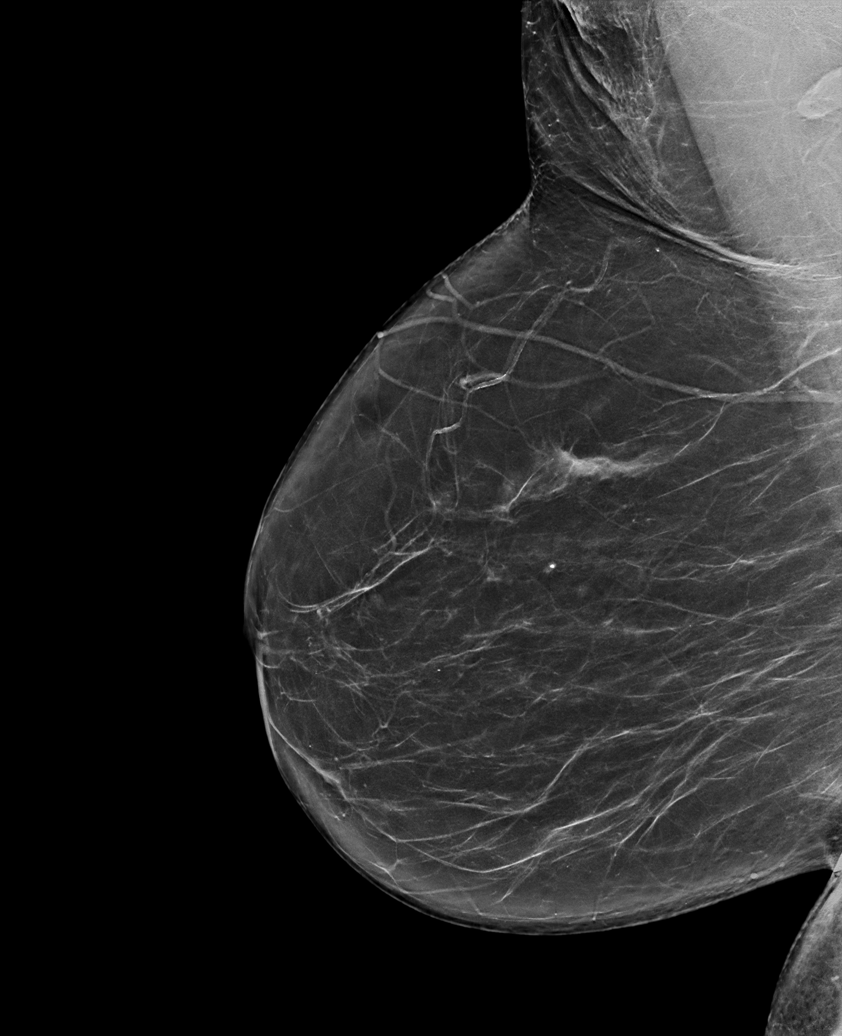

[L MLO tomo · tomo slice 43/86.0]
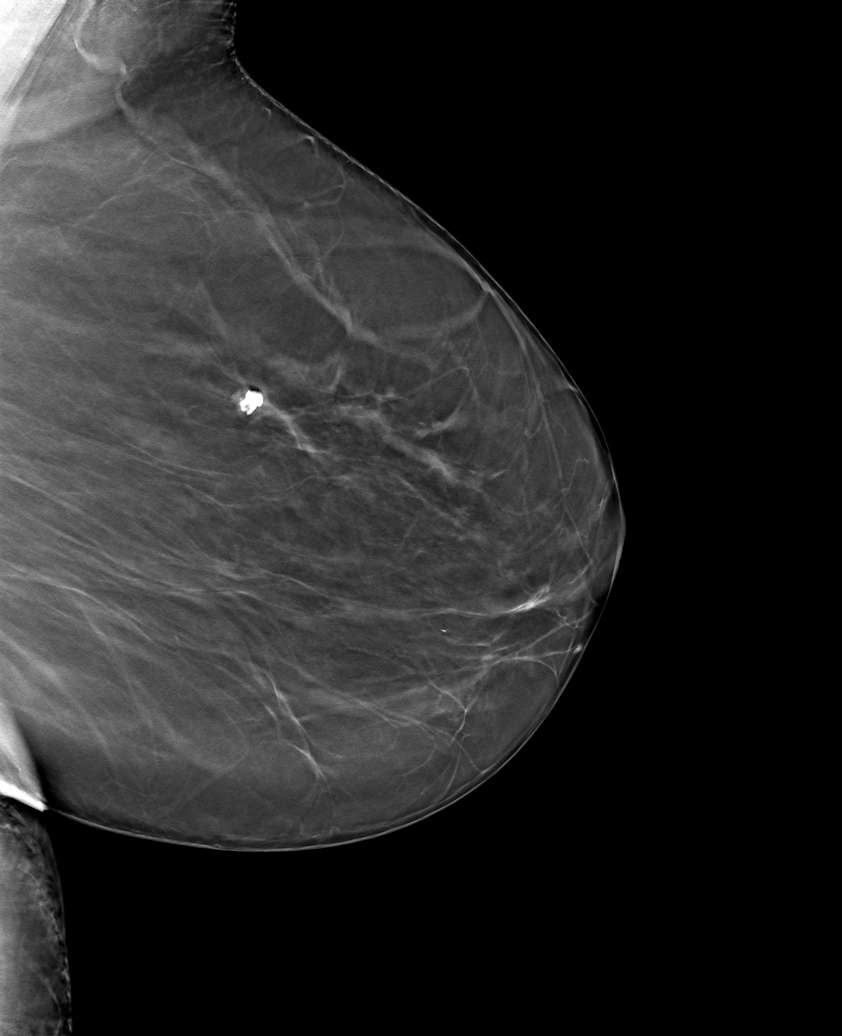

[6 of 30 positions shown; findings below may reference images not displayed]

ACR Breast Density Category b: There are scattered areas of
fibroglandular density.
FINDINGS: There are no findings suspicious for malignancy.
IMPRESSION: No mammographic evidence of malignancy. A result letter of this
screening mammogram will be mailed directly to the patient.

RECOMMENDATION:
Screening mammogram in one year. (Code:51-O-LD2)

BI-RADS CATEGORY  1: Negative.

## 2023-11-06 NOTE — Telephone Encounter (Signed)
 Pt requesting refill for ABX. For UTI. She finished 5 day supply and not feeling better.

## 2023-11-07 NOTE — Telephone Encounter (Signed)
 N/a. VM not set up.

## 2023-11-09 NOTE — Telephone Encounter (Signed)
Pt states she is burning with urination- on/off.  + drinking H20 No fevers No pelvic pain + for estrace cream 3 x a week   LV- 10/23/23   Dx -Recurrent uti- Bactrim bid x 5 completed UC- neg   F/u with [facility] on 2/25.   Advised pt to push water, urinate q 2 hours, continue with estrace. Will send to [facility] for advice.

## 2023-11-10 NOTE — Telephone Encounter (Signed)
I do not recommend additional antibiotics, since her culture with me was negative.  Okay to add her on with me with cath UA to discuss her persistent symptoms, soonest available.

## 2023-11-10 NOTE — Telephone Encounter (Signed)
N/a. VM not set up.

## 2023-11-13 ENCOUNTER — Encounter: Payer: Self-pay | Admitting: Urology

## 2023-11-13 NOTE — Telephone Encounter (Signed)
Pt l/m w/telephone advice record/accessnurse on 1/17 @ 5:03 stating she was returning call from office.

## 2023-11-13 NOTE — Telephone Encounter (Signed)
NA. No vm

## 2023-11-16 NOTE — Telephone Encounter (Signed)
Na

## 2023-11-17 ENCOUNTER — Other Ambulatory Visit: Payer: Self-pay

## 2023-11-30 ENCOUNTER — Ambulatory Visit: Payer: Medicare Other | Admitting: Urology

## 2023-11-30 VITALS — BP 136/72 | HR 108 | Ht 62.0 in | Wt 216.0 lb

## 2023-11-30 DIAGNOSIS — N200 Calculus of kidney: Secondary | ICD-10-CM | POA: Diagnosis not present

## 2023-11-30 DIAGNOSIS — N390 Urinary tract infection, site not specified: Secondary | ICD-10-CM

## 2023-11-30 DIAGNOSIS — Z87440 Personal history of urinary (tract) infections: Secondary | ICD-10-CM | POA: Diagnosis not present

## 2023-11-30 LAB — MICROSCOPIC EXAMINATION

## 2023-11-30 LAB — URINALYSIS, COMPLETE
Bilirubin, UA: NEGATIVE
Ketones, UA: NEGATIVE
Leukocytes,UA: NEGATIVE
Nitrite, UA: POSITIVE — AB
Protein,UA: NEGATIVE
Specific Gravity, UA: 1.025 (ref 1.005–1.030)
Urobilinogen, Ur: 1 mg/dL (ref 0.2–1.0)
pH, UA: 5 (ref 5.0–7.5)

## 2023-11-30 MED ORDER — ESTRADIOL 0.1 MG/GM VA CREA: TOPICAL_CREAM | VAGINAL | 12 refills | Status: DC

## 2023-11-30 NOTE — Progress Notes (Signed)
   11/30/2023 12:55 PM   [name] 05/29/1946 [id number]  Reason for visit: Follow up History of recurrent, right renal stone  HPI: 78 year old female who I originally met in July 2024 for recurrent UTIs.  Most of those prior cultures were not available to me.  She opted for trial of estradiol  cream, KUB at that time showed a nonobstructive appearing 1 cm right upper pole renal stone.  She has not had any culture documented urinary infections since starting the estradiol  cream.  She had 1 episode of dysuria in December 2024, however culture was negative and she was using a new scented feminine wipe at that time.  She continues with the topical estrogen cream, denies any problems with urination today.  I personally viewed and interpreted the CT stone protocol ordered by Lucie Hones, PA on 11/06/2023, 1 cm right upper pole renal stone, no hydronephrosis, 900HU, 13cm SSD, clearly seen on scout film.  Urinalysis today benign with chronic 3-10 low-grade microscopic hematuria most likely secondary to renal stone.  We discussed options for her nonobstructive right renal stone including surveillance, shockwave lithotripsy, or ureteroscopy.  Risk and benefits were reviewed extensively.  Using shared decision making she opts for surveillance, but may want to consider shockwave lithotripsy in the future.  In terms of her dysuria and history of recurrent UTIs, I recommended continuing the topical estrogen cream, she has not had any culture documented infections since starting that.  Her intermittent dysuria is likely secondary to scented feminine wipes and I encouraged her to avoid those.  -We discussed return precautions including gross hematuria right-sided flank pain, if recurrent infections could consider treatment of right renal stone and she prefers shockwave lithotripsy -Continue topical estrogen cream -RTC 9 months KUB prior     [name], MD  [facility]  Urology [address] [place], [place] [address] [phone]

## 2023-11-30 NOTE — Patient Instructions (Signed)

## 2023-12-25 ENCOUNTER — Other Ambulatory Visit: Payer: Self-pay | Admitting: Internal Medicine

## 2023-12-25 DIAGNOSIS — Z1231 Encounter for screening mammogram for malignant neoplasm of breast: Secondary | ICD-10-CM

## 2024-01-25 ENCOUNTER — Ambulatory Visit: Admitting: Physician Assistant

## 2024-01-25 VITALS — BP 155/79 | HR 93

## 2024-01-25 DIAGNOSIS — R8281 Pyuria: Secondary | ICD-10-CM

## 2024-01-25 LAB — MICROSCOPIC EXAMINATION: Epithelial Cells (non renal): >10 /HPF — AB (ref 0–10)

## 2024-01-25 LAB — URINALYSIS, COMPLETE
Bilirubin, UA: NEGATIVE
Glucose, UA: NEGATIVE
Ketones, UA: NEGATIVE
Nitrite, UA: NEGATIVE
Specific Gravity, UA: >1.03 — ABNORMAL HIGH (ref 1.005–1.030)
Urobilinogen, Ur: 0.2 mg/dL (ref 0.2–1.0)
pH, UA: 6 (ref 5.0–7.5)

## 2024-01-25 NOTE — Progress Notes (Signed)
 01/25/2024 4:48 PM   Bethany Austin [birth year] [id number]  CC: No chief complaint on file.  HPI: Bethany Austin is a 78 y.o. 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   She recently saw Dr. Dario Guardian, who got a UA on her.  It was abnormal, possibly with WBCs.  He recommended she follow-up with [place] for further evaluation.  Today she reports no dysuria, gross hematuria, flank pain, nausea, or vomiting.  She feels completely fine and has no acute concerns.  In-office UA today positive for 1+ blood, trace protein, and 1+ leukocytes; urine microscopy with 11-30 WBCs/HPF, 3-10 RBCs/HPF, >10 epithelial cells/hpf, and moderate bacteria.   PMH: Past Medical History:  Diagnosis Date   Hypercholesteremia    Hypertension    Hyperthyroidism    Urinary tract infection     Surgical History: Past Surgical History:  Procedure Laterality Date   ABDOMINAL HYSTERECTOMY     BREAST EXCISIONAL BIOPSY Right 1975   negative   BREAST EXCISIONAL BIOPSY Right 2005   negative   COLONOSCOPY WITH PROPOFOL N/A 01/06/2021   Procedure: COLONOSCOPY WITH PROPOFOL;  Surgeon: Pasty Spillers, MD;  Location: [facility] ENDOSCOPY;  Service: Endoscopy;  Laterality: N/A;    Home Medications:  Allergies as of 01/25/2024   No Known Allergies      Medication List        Accurate as of January 25, 2024  4:48 PM. If you have any questions, ask your nurse or doctor.          acetaminophen 325 MG tablet Commonly known as: TYLENOL Take 650 mg by mouth every 6 (six) hours as needed.   aspirin 81 MG chewable tablet Chew by mouth.   atorvastatin 10 MG tablet Commonly known as: LIPITOR Take 10 mg by mouth daily.   estradiol 0.1 MG/GM vaginal cream Commonly known as: ESTRACE Estrogen Cream Instruction Discard applicator Apply pea sized amount to tip of finger to urethra before bed. Wash hands  well after application. Use Monday, Wednesday and Friday   fexofenadine-pseudoephedrine 60-120 MG 12 hr tablet Commonly known as: ALLEGRA-D Take 1 tablet by mouth 2 (two) times daily.   levothyroxine 50 MCG tablet Commonly known as: SYNTHROID Take 50 mcg by mouth daily before breakfast.   lisinopril-hydrochlorothiazide 20-25 MG tablet Commonly known as: ZESTORETIC Take 1 tablet by mouth daily.        Allergies:  No Known Allergies  Family History: Family History  Problem Relation Age of Onset   Breast cancer Neg Hx     Social History:   reports that she has never smoked. She has never used smokeless tobacco. She reports that she does not drink alcohol and does not use drugs.  Physical Exam: There were no vitals taken for this visit.  Constitutional:  Alert and oriented, no acute distress, nontoxic appearing HEENT: [place], AT Cardiovascular: No clubbing, cyanosis, or edema Respiratory: Normal respiratory effort, no increased work of breathing Skin: No rashes, bruises or suspicious lesions Neurologic: Grossly intact, no focal deficits, moving all 4 extremities Psychiatric: Normal mood and affect  Laboratory Data: Results for orders placed or performed in visit on 01/25/24  Microscopic Examination   Collection Time: 01/25/24  3:43 PM   Urine  Result Value Ref Range   WBC, UA 11-30 (A) 0 - 5 /hpf   RBC, Urine 3-10 (A) 0 - 2 /hpf   Epithelial Cells (  non renal) >10 (A) 0 - 10 /hpf   Mucus, UA Present (A) Not Estab.   Bacteria, UA Moderate (A) None seen/Few  Urinalysis, Complete   Collection Time: 01/25/24  3:43 PM  Result Value Ref Range   Specific Gravity, UA >1.030 (H) 1.005 - 1.030   pH, UA 6.0 5.0 - 7.5   Color, UA Yellow Yellow   Appearance Ur Clear Clear   Leukocytes,UA 1+ (A) Negative   Protein,UA Trace (A) Negative/Trace   Glucose, UA Negative Negative   Ketones, UA Negative Negative   RBC, UA 1+ (A) Negative   Bilirubin, UA Negative Negative    Urobilinogen, Ur 0.2 0.2 - 1.0 mg/dL   Nitrite, UA Negative Negative   Microscopic Examination See below:    Assessment & Plan:   1. Pyuria (Primary) UA is positive, though contaminated, and she is completely asymptomatic.  Not clinically infected today.  Will send for culture and reassess symptoms before treating.  I strongly suspect asymptomatic bacteriuria.  I reassured the patient. - Urinalysis, Complete - CULTURE, URINE COMPREHENSIVE  Return for Will call with results.  Carman Ching, PA-C  [facility] [place] [address] [place], [place] [address] [phone]

## 2024-01-30 LAB — CULTURE, URINE COMPREHENSIVE

## 2024-05-10 ENCOUNTER — Ambulatory Visit
Admission: RE | Admit: 2024-05-10 | Discharge: 2024-05-10 | Disposition: A | Discharge status: Home / Self Care | Source: Ambulatory Visit | Attending: Internal Medicine | Admitting: Internal Medicine

## 2024-05-10 DIAGNOSIS — Z1231 Encounter for screening mammogram for malignant neoplasm of breast: Secondary | ICD-10-CM | POA: Diagnosis present

## 2024-05-30 ENCOUNTER — Encounter: Payer: Self-pay | Admitting: Urology

## 2024-06-04 ENCOUNTER — Ambulatory Visit: Payer: Self-pay | Admitting: Cardiology

## 2024-06-04 ENCOUNTER — Ambulatory Visit (INDEPENDENT_AMBULATORY_CARE_PROVIDER_SITE_OTHER): Payer: Self-pay | Admitting: Cardiology

## 2024-06-04 ENCOUNTER — Encounter: Payer: Self-pay | Admitting: Cardiology

## 2024-06-04 VITALS — BP 146/84 | HR 85 | Ht 62.0 in | Wt 209.6 lb

## 2024-06-04 DIAGNOSIS — E039 Hypothyroidism, unspecified: Secondary | ICD-10-CM | POA: Diagnosis not present

## 2024-06-04 DIAGNOSIS — R350 Frequency of micturition: Secondary | ICD-10-CM

## 2024-06-04 DIAGNOSIS — I10 Essential (primary) hypertension: Secondary | ICD-10-CM | POA: Insufficient documentation

## 2024-06-04 DIAGNOSIS — E782 Mixed hyperlipidemia: Secondary | ICD-10-CM | POA: Diagnosis not present

## 2024-06-04 DIAGNOSIS — Z713 Dietary counseling and surveillance: Secondary | ICD-10-CM | POA: Insufficient documentation

## 2024-06-04 DIAGNOSIS — Z131 Encounter for screening for diabetes mellitus: Secondary | ICD-10-CM

## 2024-06-04 DIAGNOSIS — Z7689 Persons encountering health services in other specified circumstances: Secondary | ICD-10-CM | POA: Insufficient documentation

## 2024-06-04 LAB — POCT URINALYSIS DIPSTICK
Bilirubin, UA: NEGATIVE
Blood, UA: NEGATIVE
Glucose, UA: NEGATIVE
Ketones, UA: 5
Nitrite, UA: NEGATIVE
Protein, UA: NEGATIVE
Spec Grav, UA: 1.01 (ref 1.010–1.025)
Urobilinogen, UA: 0.2 U/dL
pH, UA: 6 (ref 5.0–8.0)

## 2024-06-04 MED ORDER — NITROFURANTOIN MONOHYD MACRO 100 MG PO CAPS: 100.0000 mg | ORAL_CAPSULE | Freq: Two times a day (BID) | ORAL | 0 refills | Status: AC

## 2024-06-04 MED ORDER — ATORVASTATIN CALCIUM 10 MG PO TABS: 10.0000 mg | ORAL_TABLET | Freq: Every day | ORAL | 1 refills | Status: AC

## 2024-06-04 MED ORDER — LISINOPRIL-HYDROCHLOROTHIAZIDE 20-25 MG PO TABS: 1.0000 | ORAL_TABLET | Freq: Every day | ORAL | 1 refills | Status: DC

## 2024-06-04 MED ORDER — LEVOTHYROXINE SODIUM 50 MCG PO TABS: 50.0000 ug | ORAL_TABLET | Freq: Every day | ORAL | 1 refills | Status: DC

## 2024-06-04 NOTE — Progress Notes (Signed)
 New Patient Office Visit  Subjective   Patient ID: Bethany Austin, female    DOB: July 05, 1946  Age: 78 y.o. MRN: [id number]  CC:  Chief Complaint  Patient presents with   New Patient (Initial Visit)    New patient    HPI MAHNOOR MATHISEN presents to establish care Previous Primary Care provider/office:   she does have additional concerns to discuss today.   Patient in office to establish care. Patient doing well over all. Her spouse died one year ago 05/08/24. States she has good days and bad days, mostly good. Stays busy with family and her garden, working at [employer] part time.  Patient thinks she may have a UTI. Has a kidney stone that urology is monitoring. UA today abnormal, will send for culture. Will send in Macrobid .  Fasting lab work prior to next visit.     Outpatient Encounter Medications as of 06/04/2024  Medication Sig   acetaminophen (TYLENOL) 325 MG tablet Take 650 mg by mouth every 6 (six) hours as needed.   aspirin 81 MG chewable tablet Chew by mouth.   Calcium  Carbonate-Vit D-Min (CALTRATE 600+D PLUS MINERALS) 600-800 MG-UNIT TABS Take by mouth daily.   estradiol  (ESTRACE ) 0.1 MG/GM vaginal cream Estrogen Cream Instruction Discard applicator Apply pea sized amount to tip of finger to urethra before bed. Wash hands well after application. Use Monday, Wednesday and Friday   fexofenadine -pseudoephedrine (ALLEGRA-D) 60-120 MG 12 hr tablet Take 1 tablet by mouth 2 (two) times daily.   Multiple Vitamins-Minerals (MULTIVITAMIN WOMEN 50+ PO) Take by mouth daily.   nitrofurantoin , macrocrystal-monohydrate, (MACROBID ) 100 MG capsule Take 1 capsule (100 mg total) by mouth 2 (two) times daily for 7 days.   [DISCONTINUED] atorvastatin  (LIPITOR) 10 MG tablet Take 10 mg by mouth daily.   [DISCONTINUED] levothyroxine  (SYNTHROID , LEVOTHROID) 50 MCG tablet Take 50 mcg by mouth daily before breakfast.   [DISCONTINUED] lisinopril -hydrochlorothiazide  (ZESTORETIC ) 20-25 MG tablet Take 1  tablet by mouth daily.   atorvastatin  (LIPITOR) 10 MG tablet Take 1 tablet (10 mg total) by mouth daily.   levothyroxine  (SYNTHROID ) 50 MCG tablet Take 1 tablet (50 mcg total) by mouth daily before breakfast.   lisinopril -hydrochlorothiazide  (ZESTORETIC ) 20-25 MG tablet Take 1 tablet by mouth daily.   No facility-administered encounter medications on file as of 06/04/2024.    Past Medical History:  Diagnosis Date   Hypercholesteremia    Hypertension    Hyperthyroidism    Urinary tract infection     Past Surgical History:  Procedure Laterality Date   ABDOMINAL HYSTERECTOMY     BREAST EXCISIONAL BIOPSY Right 1975   negative   BREAST EXCISIONAL BIOPSY Right 2005   negative   COLONOSCOPY WITH PROPOFOL  N/A 01/06/2021   Procedure: COLONOSCOPY WITH PROPOFOL ;  Surgeon: Janalyn Keene NOVAK, MD;  Location: [facility] ENDOSCOPY;  Service: Endoscopy;  Laterality: N/A;    Family History  Problem Relation Age of Onset   Breast cancer Neg Hx     Social History   Socioeconomic History   Marital status: Married    Spouse name: Not on file   Number of children: Not on file   Years of education: Not on file   Highest education level: Not on file  Occupational History   Not on file  Tobacco Use   Smoking status: Never   Smokeless tobacco: Never  Vaping Use   Vaping status: Never Used  Substance and Sexual Activity   Alcohol use: Never   Drug use: Never  Sexual activity: Not Currently    Birth control/protection: Post-menopausal  Other Topics Concern   Not on file  Social History Narrative   Not on file   Social Drivers of Health   Financial Resource Strain: Not on file  Food Insecurity: Not on file  Transportation Needs: Not on file  Physical Activity: Not on file  Stress: Not on file  Social Connections: Not on file  Intimate Partner Violence: Not on file    Review of Systems  Constitutional: Negative.   HENT: Negative.    Eyes: Negative.   Respiratory: Negative.   Negative for shortness of breath.   Cardiovascular: Negative.  Negative for chest pain.  Gastrointestinal: Negative.  Negative for abdominal pain, constipation and diarrhea.  Genitourinary:  Positive for dysuria and frequency. Negative for flank pain.  Musculoskeletal:  Negative for joint pain and myalgias.  Skin: Negative.   Neurological: Negative.  Negative for dizziness and headaches.  Endo/Heme/Allergies: Negative.   All other systems reviewed and are negative.       Objective   BP (!) 146/84   Pulse 85   Ht 5' 2 (1.575 m)   Wt 209 lb 9.6 oz (95.1 kg)   SpO2 98%   BMI 38.34 kg/m   Physical Exam Vitals and nursing note reviewed.  Constitutional:      Appearance: Normal appearance. She is normal weight.  HENT:     Head: Normocephalic and atraumatic.     Nose: Nose normal.     Mouth/Throat:     Mouth: Mucous membranes are moist.  Eyes:     Extraocular Movements: Extraocular movements intact.     Conjunctiva/sclera: Conjunctivae normal.     Pupils: Pupils are equal, round, and reactive to light.  Cardiovascular:     Rate and Rhythm: Normal rate and regular rhythm.     Pulses: Normal pulses.     Heart sounds: Normal heart sounds.  Pulmonary:     Effort: Pulmonary effort is normal.     Breath sounds: Normal breath sounds.  Abdominal:     General: Abdomen is flat. Bowel sounds are normal.     Palpations: Abdomen is soft.  Musculoskeletal:        General: Normal range of motion.     Cervical back: Normal range of motion.  Skin:    General: Skin is warm and dry.  Neurological:     General: No focal deficit present.     Mental Status: She is alert and oriented to person, place, and time.  Psychiatric:        Mood and Affect: Mood normal.        Behavior: Behavior normal.        Thought Content: Thought content normal.        Judgment: Judgment normal.        Assessment & Plan:  Urine culture Macrobid  Fasting lab work prior to next visit  Problem List  Items Addressed This Visit       Cardiovascular and Mediastinum   Primary hypertension   Relevant Medications   lisinopril -hydrochlorothiazide  (ZESTORETIC ) 20-25 MG tablet   atorvastatin  (LIPITOR) 10 MG tablet   Other Relevant Orders   POCT Urinalysis Dipstick (18997) (Completed)   CMP14+EGFR     Endocrine   Hypothyroidism   Relevant Medications   levothyroxine  (SYNTHROID ) 50 MCG tablet   Other Relevant Orders   POCT Urinalysis Dipstick (81002) (Completed)   TSH     Other   Mixed hyperlipidemia - Primary  Relevant Medications   lisinopril -hydrochlorothiazide  (ZESTORETIC ) 20-25 MG tablet   atorvastatin  (LIPITOR) 10 MG tablet   Other Relevant Orders   POCT Urinalysis Dipstick (18997) (Completed)   Lipid Profile   Encounter to establish care   Relevant Orders   POCT Urinalysis Dipstick (18997) (Completed)   Other Visit Diagnoses       Urinary frequency       Relevant Orders   Urine Culture     Diabetes mellitus screening       Relevant Orders   Hemoglobin A1c       Return in about 3 months (around 09/04/2024) for fasting labs prior.   Total time spent: 25 minutes  [employer], NP  06/04/2024   This document may have been prepared by Dragon Voice Recognition software and as such may include unintentional dictation errors.

## 2024-06-06 LAB — URINE CULTURE

## 2024-06-07 ENCOUNTER — Telehealth: Payer: Self-pay

## 2024-06-07 NOTE — Telephone Encounter (Signed)
 Pt LM asking for call back but did not state what was needed

## 2024-06-07 NOTE — Telephone Encounter (Signed)
 See mychart message for UA results

## 2024-08-04 ENCOUNTER — Ambulatory Visit
Admission: EM | Admit: 2024-08-04 | Discharge: 2024-08-04 | Disposition: A | Discharge status: Home / Self Care | Attending: Emergency Medicine | Admitting: Emergency Medicine

## 2024-08-04 ENCOUNTER — Encounter: Payer: Self-pay | Admitting: Emergency Medicine

## 2024-08-04 DIAGNOSIS — N3000 Acute cystitis without hematuria: Secondary | ICD-10-CM | POA: Insufficient documentation

## 2024-08-04 DIAGNOSIS — R350 Frequency of micturition: Secondary | ICD-10-CM | POA: Insufficient documentation

## 2024-08-04 LAB — POCT URINE DIPSTICK
Bilirubin, UA: NEGATIVE
Glucose, UA: 100 mg/dL — AB
Ketones, POC UA: NEGATIVE mg/dL
Nitrite, UA: POSITIVE — AB
POC PROTEIN,UA: 30 — AB
Spec Grav, UA: 1.025 (ref 1.010–1.025)
Urobilinogen, UA: 2 U/dL — AB
pH, UA: 5.5 (ref 5.0–8.0)

## 2024-08-04 MED ORDER — CIPROFLOXACIN HCL 500 MG PO TABS: 500.0000 mg | ORAL_TABLET | Freq: Two times a day (BID) | ORAL | 0 refills | Status: AC

## 2024-08-04 NOTE — ED Provider Notes (Signed)
 Bethany Austin    CSN: [id number] Arrival date & time: 08/04/24  1448      History   Chief Complaint Chief Complaint  Patient presents with   Flank Pain   Urinary Frequency   Dysuria    HPI Bethany Austin is a 78 y.o. female.   Patient presents for evaluation of urinary frequency, dysuria and left-sided flank pain beginning yesterday evening.  History of kidney stone to the right side.  Denies hematuria, abdominal plain, fever or vaginal symptoms.  History of reoccurring UTI.  Past Medical History:  Diagnosis Date   Hypercholesteremia    Hypertension    Hyperthyroidism    Urinary tract infection     Patient Active Problem List   Diagnosis Date Noted   Mixed hyperlipidemia 06/04/2024   Encounter to establish care 06/04/2024   Primary hypertension 06/04/2024   Hypothyroidism 06/04/2024    Past Surgical History:  Procedure Laterality Date   ABDOMINAL HYSTERECTOMY     BREAST EXCISIONAL BIOPSY Right 1975   negative   BREAST EXCISIONAL BIOPSY Right 2005   negative   COLONOSCOPY WITH PROPOFOL  N/A 01/06/2021   Procedure: COLONOSCOPY WITH PROPOFOL ;  Surgeon: Janalyn Keene NOVAK, MD;  Location: [facility] END[facility]COPY;  Service: Endoscopy;  Laterality: N/A;    OB History   No obstetric history on file.      Home Medications    Prior to Admission medications   Medication Sig Start Date End Date Taking? Authorizing Provider  ciprofloxacin (CIPRO) 500 MG tablet Take 1 tablet (500 mg total) by mouth 2 (two) times daily for 5 days. 08/04/24 08/09/24 Yes Fannie Gathright, Shelba SAUNDERS, NP  acetaminophen (TYLENOL) 325 MG tablet Take 650 mg by mouth every 6 (six) hours as needed.    [provider]  aspirin 81 MG chewable tablet Chew by mouth.    [provider]  atorvastatin  (LIPITOR) 10 MG tablet Take 1 tablet (10 mg total) by mouth daily. 06/04/24   Scoggins, Amber, NP  Calcium  Carbonate-Vit D-Min (CALTRATE 600+D PLUS MINERALS) 600-800 MG-UNIT TABS Take by mouth  daily.    [provider]  estradiol  (ESTRACE ) 0.1 MG/GM vaginal cream Estrogen Cream Instruction Discard applicator Apply pea sized amount to tip of finger to urethra before bed. Wash hands well after application. Use Monday, Wednesday and Friday 11/30/23   Francisca Redell BROCKS, MD  fexofenadine -pseudoephedrine (ALLEGRA-D) 60-120 MG 12 hr tablet Take 1 tablet by mouth 2 (two) times daily. 10/15/19   Claudene Tanda POUR, PA-C  levothyroxine  (SYNTHROID ) 50 MCG tablet Take 1 tablet (50 mcg total) by mouth daily before breakfast. 06/04/24   Scoggins, Amber, NP  lisinopril -hydrochlorothiazide  (ZESTORETIC ) 20-25 MG tablet Take 1 tablet by mouth daily. 06/04/24   Scoggins, Amber, NP  Multiple Vitamins-Minerals (MULTIVITAMIN WOMEN 50+ PO) Take by mouth daily.    [provider]    Family History Family History  Problem Relation Age of Onset   Breast cancer Neg Hx     Social History Social History   Tobacco Use   Smoking status: Never   Smokeless tobacco: Never  Vaping Use   Vaping status: Never Used  Substance Use Topics   Alcohol use: Never   Drug use: Never     Allergies   Patient has no known allergies.   Review of Systems Review of Systems   Physical Exam Triage Vital Signs ED Triage Vitals  Encounter Vitals Group     BP 08/04/24 1505 130/75     Girls Systolic BP Percentile --  Girls Diastolic BP Percentile --      Boys Systolic BP Percentile --      Boys Diastolic BP Percentile --      Pulse Rate 08/04/24 1505 78     Resp 08/04/24 1505 18     Temp 08/04/24 1505 98.2 F (36.8 C)     Temp Source 08/04/24 1505 Oral     SpO2 08/04/24 1505 98 %     Weight --      Height --      Head Circumference --      Peak Flow --      Pain Score 08/04/24 1502 5     Pain Loc --      Pain Education --      Exclude from Growth Chart --    No data found.  Updated Vital Signs BP 130/75 (BP Location: Left Arm)   Pulse 78   Temp 98.2 F (36.8 C) (Oral)   Resp 18    SpO2 98%   Visual Acuity Right Eye Distance:   Left Eye Distance:   Bilateral Distance:    Right Eye Near:   Left Eye Near:    Bilateral Near:     Physical Exam Constitutional:      Appearance: Normal appearance.  Eyes:     Extraocular Movements: Extraocular movements intact.  Pulmonary:     Effort: Pulmonary effort is normal.  Abdominal:     Tenderness: There is no abdominal tenderness. There is no right CVA tenderness, left CVA tenderness or guarding.  Neurological:     Mental Status: She is alert and oriented to person, place, and time. Mental status is at baseline.      UC Treatments / Results  Labs (all labs ordered are listed, but only abnormal results are displayed) Labs Reviewed  POCT URINE DIPSTICK - Abnormal; Notable for the following components:      Result Value   Clarity, UA cloudy (*)    Glucose, UA =100 (*)    Blood, UA trace-intact (*)    POC PROTEIN,UA =30 (*)    Urobilinogen, UA 2.0 (*)    Nitrite, UA Positive (*)    Leukocytes, UA Large (3+) (*)    All other components within normal limits  URINE CULTURE    EKG   Radiology No results found.  Procedures Procedures (including critical care time)  Medications Ordered in UC Medications - No data to display  Initial Impression / Assessment and Plan / UC Course  I have reviewed the triage vital signs and the nursing notes.  Pertinent labs & imaging results that were available during my care of the patient were reviewed by me and considered in my medical decision making (see chart for details).  Acute cystitis without hematuria, urinary frequency  Urinalysis showing leukocytes and nitrates, sent for culture, per chart review E. coli typically within urine sample therefore ciprofloxacin prescribed, discussed administration recommend over-the-counter medications and nonpharmacological supportive care with follow-up with Final Clinical Impressions(s) / UC Diagnoses   Final diagnoses:  Urinary  frequency  Acute cystitis without hematuria     Discharge Instructions      Your urinalysis shows [name] blood cells and nitrates which are indicative of infection, your urine will be sent to the lab to determine exactly which bacteria is present, if any changes need to be made to your medications you will be notified  Begin use of ciprofloxacin twice daily for 5 days  You may use over-the-counter Azo to  help minimize your symptoms until antibiotic removes bacteria, this medication will turn your urine orange  Increase your fluid intake through use of water  As always practice good hygiene, wiping front to back and avoidance of scented vaginal products to prevent further irritation  If symptoms continue to persist after use of medication or recur please follow-up with urgent care or your primary doctor as needed    ED Prescriptions     Medication Sig Dispense Auth. Provider   ciprofloxacin (CIPRO) 500 MG tablet Take 1 tablet (500 mg total) by mouth 2 (two) times daily for 5 days. 10 tablet Malary Aylesworth R, NP      PDMP not reviewed this encounter.   Teresa Shelba SAUNDERS, NP 08/04/24 [phone]

## 2024-08-04 NOTE — ED Triage Notes (Signed)
 Patient reports left sided flank, urinary frequency and painful urination that started yesterday. Patient took AZO and stated it help with burning sensation. Rates 5/10. Patient has history of kidney stones on right side.

## 2024-08-04 NOTE — Discharge Instructions (Signed)
 Your urinalysis shows Bethany Austin blood cells and nitrates which are indicative of infection, your urine will be sent to the lab to determine exactly which bacteria is present, if any changes need to be made to your medications you will be notified  Begin use of ciprofloxacin  twice daily for 5 days  You may use over-the-counter Azo to help minimize your symptoms until antibiotic removes bacteria, this medication will turn your urine orange  Increase your fluid intake through use of water   As always practice good hygiene, wiping front to back and avoidance of scented vaginal products to prevent further irritation  If symptoms continue to persist after use of medication or recur please follow-up with urgent care or your primary doctor as needed

## 2024-08-05 LAB — URINE CULTURE: Culture: NO GROWTH

## 2024-08-06 ENCOUNTER — Ambulatory Visit (HOSPITAL_COMMUNITY): Payer: Self-pay

## 2024-08-07 ENCOUNTER — Telehealth: Payer: Self-pay | Admitting: Emergency Medicine

## 2024-08-07 MED ORDER — FLUCONAZOLE 150 MG PO TABS: 150.0000 mg | ORAL_TABLET | ORAL | 0 refills | Status: DC | PRN

## 2024-08-07 NOTE — Telephone Encounter (Signed)
 Seen in this urgent care by this provider, prescribed antibiotics for urinary infection, called to notify the urgent care that she is feeling  yeasty, prescribed Diflucan

## 2024-08-28 ENCOUNTER — Ambulatory Visit
Admission: RE | Admit: 2024-08-28 | Discharge: 2024-08-28 | Disposition: A | Source: Ambulatory Visit | Attending: Urology | Admitting: Urology

## 2024-08-28 ENCOUNTER — Ambulatory Visit
Admission: RE | Admit: 2024-08-28 | Discharge: 2024-08-28 | Disposition: A | Discharge status: Home / Self Care | Source: Ambulatory Visit | Attending: Urology | Admitting: Urology

## 2024-08-28 ENCOUNTER — Ambulatory Visit: Admitting: Urology

## 2024-08-28 ENCOUNTER — Other Ambulatory Visit: Payer: Self-pay

## 2024-08-28 VITALS — BP 136/75 | HR 85 | Ht 62.0 in | Wt 212.5 lb

## 2024-08-28 DIAGNOSIS — N200 Calculus of kidney: Secondary | ICD-10-CM

## 2024-08-28 DIAGNOSIS — R300 Dysuria: Secondary | ICD-10-CM | POA: Diagnosis not present

## 2024-08-28 DIAGNOSIS — N958 Other specified menopausal and perimenopausal disorders: Secondary | ICD-10-CM | POA: Diagnosis not present

## 2024-08-28 LAB — MICROSCOPIC EXAMINATION

## 2024-08-28 MED ORDER — LISINOPRIL-HYDROCHLOROTHIAZIDE 20-25 MG PO TABS: 1.0000 | ORAL_TABLET | Freq: Every day | ORAL | 1 refills | Status: AC

## 2024-08-28 MED ORDER — ESTRADIOL 0.01 % VA CREA: TOPICAL_CREAM | VAGINAL | 12 refills | Status: DC

## 2024-08-28 MED ORDER — LEVOTHYROXINE SODIUM 50 MCG PO TABS: 50.0000 ug | ORAL_TABLET | Freq: Every day | ORAL | 1 refills | Status: AC

## 2024-08-28 NOTE — Progress Notes (Signed)
 [facility] ORDER FORM  Expected date of procedure: Patient prefers after December 13  Surgeon: MD on truck  Post op standing: 2-4wk follow up w/KUB prior  Anticoagulation/Aspirin/NSAID standing order: Hold all 72 hours prior  Anesthesia standing order: MAC  VTE standing: SCD's  Dx: Right Nephrolihtiasis  Procedure: Right extracorporeal shock wave lithotripsy  CPT : 50590  Standing Order Set:   *NPO after mn, KUB  *NS 151ml/hr, Keflex 500mg  PO, Benadryl 25mg  PO, Valium 10mg  PO, Zofran 4mg  IV  Medications if other than standing orders:   NONE

## 2024-08-28 NOTE — Patient Instructions (Signed)
 ESWL for Kidney Stones  Extracorporeal shock wave lithotripsy (ESWL) is a treatment that can help break up kidney stones that are too large to pass on their own.  This is a nonsurgical procedure that breaks up a kidney stone with shock waves. These shock waves pass through your body and focus on the kidney stone. They cause the kidney stone to break into smaller pieces (fragments) while it is still in the urinary tract. The fragments of stone can pass more easily out of your body in the pee (urine). Tell a health care provider about: Any allergies you have. All medicines you are taking, including vitamins, herbs, eye drops, creams, and over-the-counter medicines. Any problems you or family members have had with anesthetic medicines. Any bleeding problems you have. Any surgeries you've had. Any medical conditions you have. Whether you're pregnant or may be pregnant. What are the risks? Your health care provider will talk with you about risks. These may include: Infection. Bleeding from the kidney. Bruising of the kidney or skin. Scarring of the kidney. This can lead to: Increased blood pressure. Poor kidney function. Return (recurrence) of kidney stones. Damage to other structures or organs. This may include the liver, colon, spleen, or pancreas. Blockage (obstruction) of the tube that carries pee from the kidney to the bladder (ureter). Failure of the kidney stone to break into fragments. What happens before the procedure? When to stop eating and drinking Follow instructions from your health care provider about what you may eat and drink. These may include: 8 hours before your procedure Stop eating most foods. Do not eat meat, fried foods, or fatty foods. Eat only light foods, such as toast or crackers. All liquids are okay except energy drinks and alcohol. 6 hours before your procedure Stop eating. Drink only clear liquids, such as water, clear fruit juice, black coffee, plain tea,  and sports drinks. Do not drink energy drinks or alcohol. 2 hours before your procedure Stop drinking all liquids. You may be allowed to take medicines with small sips of water. If you do not follow your health care provider's instructions, your procedure may be delayed or canceled. Medicines Ask your health care provider about: Changing or stopping your regular medicines. These include any diabetes medicines or blood thinners you take. Taking medicines such as aspirin and ibuprofen. These medicines can thin your blood. Do not take them unless your health care provider tells you to. Taking over-the-counter medicines, vitamins, herbs, and supplements. Tests You may have tests, such as: Blood tests. Pee (urine) tests. Imaging tests. This may include a CT scan. Surgery safety Ask your health care provider: How your surgery site will be marked. What steps will be taken to help prevent infection. These steps may include: Washing skin with a soap that kills germs. Receiving antibiotics. General instructions If you will be going home right after the procedure, plan to have a responsible adult: Take you home from the hospital or clinic. You will not be allowed to drive. Care for you for the time you are told. What happens during the procedure?  An IV will be inserted into one of your veins. You may be given: A sedative. This helps you relax. Anesthesia. This will: Numb certain areas of your body. Make you fall asleep for surgery. A water-filled cushion may be placed behind your kidney or on your abdomen. In some cases, you may be placed in a tub of lukewarm water. Your body will be positioned in a way that makes it  easier to target the kidney stone. An X-ray or ultrasound exam will be done to locate your stone. Shock waves will be aimed at the stone. If you are awake, you may feel a tapping sensation as the shock waves pass through your body. A small mesh tube (stent) may be placed in  your ureter. This will help keep pee flowing from the kidney if the fragments of the stone have been blocking the ureter. The stent will be removed at a later time by your health care provider. The procedure may vary among health care providers and hospitals. What happens after the procedure? Your blood pressure, heart rate, breathing rate, and blood oxygen level will be monitored until you leave the hospital or clinic. You may have an X-ray after the procedure to see how many of the kidney stones were broken up. This will also show how much of the stone has passed. If there are still large fragments after treatment, you may need to have a second procedure at a later time. This information is not intended to replace advice given to you by your health care provider. Make sure you discuss any questions you have with your health care provider. Document Revised: 04/22/2023 Document Reviewed: 02/10/2022 Elsevier Patient Education  2024 [employer].

## 2024-08-28 NOTE — Progress Notes (Signed)
   08/28/2024 4:17 PM   Bethany Austin June 21, 1946 [id number]  Reason for visit: Follow up recurrent UTI, right renal stone  History: Initial visit July 2024 for recurrent UTIs, had improvement on estradiol  cream KUB showed a 1 cm right upper pole renal stone and she opted for surveillance  Physical Exam: BP 136/75 (BP Location: Left Arm, Patient Position: Sitting, Cuff Size: Large)   Pulse 85   Ht 5' 2 (1.575 m)   Wt 212 lb 8 oz (96.4 kg)   SpO2 99%   BMI 38.87 kg/m   Imaging/labs: Positive urine culture August 2025 I personally viewed and interpreted the KUB today that shows a stable 1 cm right upper pole stone, prior CT stone 900HU, 13 cm SSD  Today: She has not been regular with the topical estrogen cream Denies any flank pain or gross hematuria She prefers more definitive management for stone, and prefers shockwave lithotripsy  Plan:   Recurrent UTI: Recommended resuming the topical estrogen cream, and cranberry tablets daily for prophylaxis Right renal stone: Discussed options again including observation, ureteroscopy, or shockwave lithotripsy.  We discussed that some patients may see some improvement in recurrent infections with definitive stone treatment.  She strongly desires stone treatment and prefers shockwave lithotripsy.  Risks and benefits discussed including bleeding, infection, obstructive fragments, need for additional treatments Resume topical estrogen cream, schedule right shockwave lithotripsy, she prefers after December 13   Bethany JAYSON Burnet, MD  [facility] [address] [place], [place] [address] [phone]

## 2024-08-29 ENCOUNTER — Ambulatory Visit: Payer: Self-pay | Admitting: Urology

## 2024-08-29 ENCOUNTER — Ambulatory Visit: Payer: Medicare Other | Admitting: Urology

## 2024-08-29 ENCOUNTER — Telehealth: Payer: Self-pay

## 2024-08-29 LAB — MICROSCOPIC EXAMINATION: Renal Epithel, UA: NONE SEEN

## 2024-08-29 LAB — URINALYSIS, COMPLETE
Bilirubin, UA: NEGATIVE
Glucose, UA: NEGATIVE
Ketones, UA: NEGATIVE
Leukocytes,UA: NEGATIVE
Nitrite, UA: NEGATIVE
Protein,UA: NEGATIVE
Specific Gravity, UA: 1.03 (ref 1.005–1.030)
Urobilinogen, Ur: 0.2 mg/dL (ref 0.2–1.0)
pH, UA: 6 (ref 5.0–7.5)

## 2024-08-29 NOTE — Telephone Encounter (Signed)
 Pt called in and I let her know that her urine had no worrisome findings or infection per Dr. Francisca. Pt was pleased

## 2024-09-03 LAB — CULTURE, URINE COMPREHENSIVE

## 2024-09-04 ENCOUNTER — Other Ambulatory Visit: Payer: Self-pay

## 2024-09-04 ENCOUNTER — Other Ambulatory Visit

## 2024-09-04 DIAGNOSIS — E782 Mixed hyperlipidemia: Secondary | ICD-10-CM

## 2024-09-04 DIAGNOSIS — Z131 Encounter for screening for diabetes mellitus: Secondary | ICD-10-CM

## 2024-09-04 DIAGNOSIS — E039 Hypothyroidism, unspecified: Secondary | ICD-10-CM

## 2024-09-04 DIAGNOSIS — I10 Essential (primary) hypertension: Secondary | ICD-10-CM

## 2024-09-05 LAB — CMP14+EGFR
ALT: 9 IU/L (ref 0–32)
AST: 11 IU/L (ref 0–40)
Albumin: 4 g/dL (ref 3.8–4.8)
Alkaline Phosphatase: 96 IU/L (ref 49–135)
BUN/Creatinine Ratio: 18 (ref 12–28)
BUN: 16 mg/dL (ref 8–27)
Bilirubin Total: <0.2 mg/dL (ref 0.0–1.2)
CO2: 26 mmol/L (ref 20–29)
Calcium: 9.2 mg/dL (ref 8.7–10.3)
Chloride: 101 mmol/L (ref 96–106)
Creatinine, Ser: 0.88 mg/dL (ref 0.57–1.00)
Globulin, Total: 2.6 g/dL (ref 1.5–4.5)
Glucose: 114 mg/dL — ABNORMAL HIGH (ref 70–99)
Potassium: 4 mmol/L (ref 3.5–5.2)
Sodium: 140 mmol/L (ref 134–144)
Total Protein: 6.6 g/dL (ref 6.0–8.5)
eGFR: 68 mL/min/1.73 (ref >59)

## 2024-09-05 LAB — LIPID PANEL
Chol/HDL Ratio: 2.7 ratio (ref 0.0–4.4)
Cholesterol, Total: 167 mg/dL (ref 100–199)
HDL: 63 mg/dL (ref >39)
LDL Chol Calc (NIH): 89 mg/dL (ref 0–99)
Triglycerides: 82 mg/dL (ref 0–149)
VLDL Cholesterol Cal: 15 mg/dL (ref 5–40)

## 2024-09-05 LAB — HEMOGLOBIN A1C
Est. average glucose Bld gHb Est-mCnc: 123 mg/dL
Hgb A1c MFr Bld: 5.9 % — ABNORMAL HIGH (ref 4.8–5.6)

## 2024-09-05 LAB — TSH: TSH: 0.726 u[IU]/mL (ref 0.450–4.500)

## 2024-09-06 ENCOUNTER — Encounter: Payer: Self-pay | Admitting: Cardiology

## 2024-09-06 ENCOUNTER — Ambulatory Visit (INDEPENDENT_AMBULATORY_CARE_PROVIDER_SITE_OTHER): Admitting: Cardiology

## 2024-09-06 VITALS — BP 124/78 | HR 77 | Ht 62.0 in | Wt 216.0 lb

## 2024-09-06 DIAGNOSIS — I10 Essential (primary) hypertension: Secondary | ICD-10-CM | POA: Diagnosis not present

## 2024-09-06 DIAGNOSIS — E039 Hypothyroidism, unspecified: Secondary | ICD-10-CM | POA: Diagnosis not present

## 2024-09-06 DIAGNOSIS — Z6839 Body mass index (BMI) 39.0-39.9, adult: Secondary | ICD-10-CM

## 2024-09-06 DIAGNOSIS — E782 Mixed hyperlipidemia: Secondary | ICD-10-CM

## 2024-09-06 DIAGNOSIS — R7303 Prediabetes: Secondary | ICD-10-CM

## 2024-09-06 DIAGNOSIS — E66812 Obesity, class 2: Secondary | ICD-10-CM

## 2024-09-06 DIAGNOSIS — Z713 Dietary counseling and surveillance: Secondary | ICD-10-CM

## 2024-09-06 MED ORDER — WEGOVY 0.25 MG/0.5ML ~~LOC~~ SOAJ: 0.2500 mg | SUBCUTANEOUS | 3 refills | Status: DC

## 2024-09-06 NOTE — Progress Notes (Signed)
 Established Patient Office Visit  Subjective:  Patient ID: Bethany Austin, female    DOB: September 28, 1946  Age: 78 y.o. MRN: [id number]  Chief Complaint  Patient presents with   Follow-up    3 months follow up lab results    Patient in office for 3 month follow up, discuss recent lab results. Patient doing well, no new complaints today. Patient interested in trying a GLP1 to help her lose weight. Will send in [facility].  Discussed recent lab work. Hgb A1c improved, still diabetic. LDL at goal. All other labs stable.  Blood pressure well controlled today.  Continue current medications.    No other concerns at this time.   Past Medical History:  Diagnosis Date   Hypercholesteremia    Hypertension    Hyperthyroidism    Urinary tract infection     Past Surgical History:  Procedure Laterality Date   ABDOMINAL HYSTERECTOMY     BREAST EXCISIONAL BIOPSY Right 1975   negative   BREAST EXCISIONAL BIOPSY Right 2005   negative   COLONOSCOPY WITH PROPOFOL  N/A 01/06/2021   Procedure: COLONOSCOPY WITH PROPOFOL ;  Surgeon: Janalyn Keene NOVAK, MD;  Location: [facility] ENDOSCOPY;  Service: Endoscopy;  Laterality: N/A;    Social History   Socioeconomic History   Marital status: Married    Spouse name: Not on file   Number of children: Not on file   Years of education: Not on file   Highest education level: Not on file  Occupational History   Not on file  Tobacco Use   Smoking status: Never   Smokeless tobacco: Never  Vaping Use   Vaping status: Never Used  Substance and Sexual Activity   Alcohol use: Never   Drug use: Never   Sexual activity: Not Currently    Birth control/protection: Post-menopausal  Other Topics Concern   Not on file  Social History Narrative   Not on file   Social Drivers of Health   Financial Resource Strain: Medium Risk (08/08/2024)   Received from [facility]   Overall Financial Resource Strain (CARDIA)    Difficulty of Paying  Living Expenses: Somewhat hard  Food Insecurity: Food Insecurity Present (08/08/2024)   Received from [facility]   Hunger Vital Sign    Within the past 12 months, you worried that your food would run out before you got the money to buy more.: Sometimes true    Within the past 12 months, the food you bought just didn't last and you didn't have money to get more.: Sometimes true  Transportation Needs: No Transportation Needs (08/08/2024)   Received from [facility] - Transportation    In the past 12 months, has lack of transportation kept you from medical appointments or from getting medications?: No    Lack of Transportation (Non-Medical): No  Physical Activity: Not on file  Stress: Not on file  Social Connections: Not on file  Intimate Partner Violence: Not on file    Family History  Problem Relation Age of Onset   Breast cancer Neg Hx     No Known Allergies  Outpatient Medications Prior to Visit  Medication Sig   acetaminophen (TYLENOL) 325 MG tablet Take 650 mg by mouth every 6 (six) hours as needed.   aspirin 81 MG chewable tablet Chew by mouth.   atorvastatin  (LIPITOR) 10 MG tablet Take 1 tablet (10 mg total) by mouth daily.   Calcium  Carbonate-Vit D-Min (CALTRATE 600+D PLUS MINERALS)  600-800 MG-UNIT TABS Take by mouth daily.   estradiol  (ESTRACE ) 0.01 % CREA vaginal cream Estrogen Cream Instructions Discard applicator Apply pea sized amount to tip of finger to urethra before bed every night for 1 week the use Monday, Wednesday and Friday. Wash hands well after application.   fexofenadine -pseudoephedrine (ALLEGRA-D) 60-120 MG 12 hr tablet Take 1 tablet by mouth 2 (two) times daily.   fluconazole (DIFLUCAN) 150 MG tablet Take 1 tablet (150 mg total) by mouth every three (3) days as needed for up to 2 doses.   levothyroxine  (SYNTHROID ) 50 MCG tablet Take 1 tablet (50 mcg total) by mouth daily before breakfast.    lisinopril -hydrochlorothiazide  (ZESTORETIC ) 20-25 MG tablet Take 1 tablet by mouth daily.   Multiple Vitamins-Minerals (MULTIVITAMIN WOMEN 50+ PO) Take by mouth daily.   No facility-administered medications prior to visit.    Review of Systems  Constitutional: Negative.   HENT: Negative.    Eyes: Negative.   Respiratory: Negative.  Negative for shortness of breath.   Cardiovascular: Negative.  Negative for chest pain.  Gastrointestinal: Negative.  Negative for abdominal pain, constipation and diarrhea.  Genitourinary: Negative.   Musculoskeletal:  Negative for joint pain and myalgias.  Skin: Negative.   Neurological: Negative.  Negative for dizziness and headaches.  Endo/Heme/Allergies: Negative.   All other systems reviewed and are negative.      Objective:   BP 124/78   Pulse 77   Ht 5' 2 (1.575 m)   Wt 216 lb (98 kg)   SpO2 99%   BMI 39.51 kg/m   Vitals:   09/06/24 1511  BP: 124/78  Pulse: 77  Height: 5' 2 (1.575 m)  Weight: 216 lb (98 kg)  SpO2: 99%  BMI (Calculated): 39.5    Physical Exam Vitals and nursing note reviewed.  Constitutional:      Appearance: Normal appearance. She is normal weight.  HENT:     Head: Normocephalic and atraumatic.     Nose: Nose normal.     Mouth/Throat:     Mouth: Mucous membranes are moist.  Eyes:     Extraocular Movements: Extraocular movements intact.     Conjunctiva/sclera: Conjunctivae normal.     Pupils: Pupils are equal, round, and reactive to light.  Cardiovascular:     Rate and Rhythm: Normal rate and regular rhythm.     Pulses: Normal pulses.     Heart sounds: Normal heart sounds.  Pulmonary:     Effort: Pulmonary effort is normal.     Breath sounds: Normal breath sounds.  Abdominal:     General: Abdomen is flat. Bowel sounds are normal.     Palpations: Abdomen is soft.  Musculoskeletal:        General: Normal range of motion.     Cervical back: Normal range of motion.  Skin:    General: Skin is warm  and dry.  Neurological:     General: No focal deficit present.     Mental Status: She is alert and oriented to person, place, and time.  Psychiatric:        Mood and Affect: Mood normal.        Behavior: Behavior normal.        Thought Content: Thought content normal.        Judgment: Judgment normal.      No results found for any visits on 09/06/24.  Recent Results (from the past 2160 hours)  POCT URINE DIPSTICK     Status: Abnormal  Collection Time: 08/04/24  3:15 PM  Result Value Ref Range   Color, UA yellow yellow   Clarity, UA cloudy (A) clear   Glucose, UA =100 (A) negative mg/dL   Bilirubin, UA negative negative   Ketones, POC UA negative negative mg/dL   Spec Grav, UA 8.974 8.989 - 1.025   Blood, UA trace-intact (A) negative   pH, UA 5.5 5.0 - 8.0   POC PROTEIN,UA =30 (A) negative, trace   Urobilinogen, UA 2.0 (A) 0.2 or 1.0 E.U./dL   Nitrite, UA Positive (A) Negative   Leukocytes, UA Large (3+) (A) Negative  Urine Culture     Status: None   Collection Time: 08/04/24  3:16 PM   Specimen: Urine, Clean Catch  Result Value Ref Range   Specimen Description URINE, CLEAN CATCH    Special Requests NONE    Culture      NO GROWTH Performed at [facility], [address], [place], [place] [address]    Report Status 08/05/2024 FINAL   Urinalysis, Complete     Status: Abnormal   Collection Time: 08/28/24  3:18 PM  Result Value Ref Range   Specific Gravity, UA 1.030 1.005 - 1.030    Comment:      >=1.030   pH, UA 6.0 5.0 - 7.5   Color, UA Yellow Yellow   Appearance Ur Clear Clear   Leukocytes,UA Negative Negative   Protein,UA Negative Negative/Trace   Glucose, UA Negative Negative   Ketones, UA Negative Negative   RBC, UA 1+ (A) Negative   Bilirubin, UA Negative Negative   Urobilinogen, Ur 0.2 0.2 - 1.0 mg/dL   Nitrite, UA Negative Negative   Microscopic Examination See below:     Comment: Microscopic was indicated and was performed.  Microscopic  Examination     Status: Abnormal   Collection Time: 08/28/24  3:18 PM   Urine  Result Value Ref Range   WBC, UA 0-5 0 - 5 /hpf   RBC, Urine 3-10 (A) 0 - 2 /hpf   Epithelial Cells (non renal) 0-10 0 - 10 /hpf   Bacteria, UA None seen None seen/Few  CULTURE, URINE COMPREHENSIVE     Status: None   Collection Time: 08/28/24  3:53 PM   Specimen: Urine   UR  Result Value Ref Range   Urine Culture, Comprehensive Final report    Organism ID, Bacteria Comment     Comment: Mixed urogenital flora 10,000-25,000 colony forming units per mL   Hemoglobin A1c     Status: Abnormal   Collection Time: 09/04/24  3:01 PM  Result Value Ref Range   Hgb A1c MFr Bld 5.9 (H) 4.8 - 5.6 %    Comment:          Prediabetes: 5.7 - 6.4          Diabetes: >6.4          Glycemic control for adults with diabetes: <7.0    Est. average glucose Bld gHb Est-mCnc 123 mg/dL  TSH     Status: None   Collection Time: 09/04/24  3:01 PM  Result Value Ref Range   TSH 0.726 0.450 - 4.500 uIU/mL  Lipid Profile     Status: None   Collection Time: 09/04/24  3:01 PM  Result Value Ref Range   Cholesterol, Total 167 100 - 199 mg/dL   Triglycerides 82 0 - 149 mg/dL   HDL 63 >60 mg/dL   VLDL Cholesterol Cal 15 5 - 40 mg/dL  LDL Chol Calc (NIH) 89 0 - 99 mg/dL   Chol/HDL Ratio 2.7 0.0 - 4.4 ratio    Comment:                                   T. Chol/HDL Ratio                                             Men  Women                               1/2 Avg.Risk  3.4    3.3                                   Avg.Risk  5.0    4.4                                2X Avg.Risk  9.6    7.1                                3X Avg.Risk 23.4   11.0   CMP14+EGFR     Status: Abnormal   Collection Time: 09/04/24  3:01 PM  Result Value Ref Range   Glucose 114 (H) 70 - 99 mg/dL   BUN 16 8 - 27 mg/dL   Creatinine, Ser 9.11 0.57 - 1.00 mg/dL   eGFR 68 >40 fO/fpw/8.26   BUN/Creatinine Ratio 18 12 - 28   Sodium 140 134 - 144 mmol/L   Potassium  4.0 3.5 - 5.2 mmol/L   Chloride 101 96 - 106 mmol/L   CO2 26 20 - 29 mmol/L   Calcium  9.2 8.7 - 10.3 mg/dL   Total Protein 6.6 6.0 - 8.5 g/dL   Albumin 4.0 3.8 - 4.8 g/dL   Globulin, Total 2.6 1.5 - 4.5 g/dL   Bilirubin Total <9.7 0.0 - 1.2 mg/dL   Alkaline Phosphatase 96 49 - 135 IU/L   AST 11 0 - 40 IU/L   ALT 9 0 - 32 IU/L      Assessment & Plan:  Wegovy Return in 4 weeks if insurance approves Wegovy  Problem List Items Addressed This Visit       Cardiovascular and Mediastinum   Primary hypertension - Primary     Endocrine   Hypothyroidism     Other   Mixed hyperlipidemia   Weight loss counseling, encounter for   Prediabetes   Class 2 severe obesity due to excess calories with serious comorbidity and body mass index (BMI) of 39.0 to 39.9 in adult   Relevant Medications   semaglutide-weight management (WEGOVY) 0.25 MG/0.5ML SOAJ SQ injection    Return in about 4 months (around 01/04/2025) for fasting labs prior.   Total time spent: 25 minutes. This time includes review of previous notes and results and patient face to face interaction during today's visit.    Jeoffrey Pollen, NP  09/06/2024   This document may have been prepared by Dragon Voice Recognition software and as such may include unintentional dictation errors.

## 2024-09-10 ENCOUNTER — Telehealth: Payer: Self-pay

## 2024-09-10 NOTE — Progress Notes (Signed)
    [facility] ESWL POSTING SHEET        Patient Name: Bethany Austin  DOB: 11-May-1946  MRN: [id number]  Surgeon:  Glendia Barba, MD  Diagnosis:  Right Nephrolihtiasis  CPT: [phone]  ESWL DATE: 10/10/2024  ESWL TIME: 0730   Special Needs/Requirements: none       Cardiac/Medical/Pulmonary Clearance needed: No       Form Faxed to [facility]- [phone] Date:   Date: 09/10/24       Form Faxed to [facility]- [phone]  Date:  Date: 09/10/24           Copy Made for Insurance PA:  Date: 09/10/24       Orders Entered in to Epic:  Date: 09/10/24

## 2024-09-10 NOTE — Telephone Encounter (Signed)
 Patient asked for Surgery Information to be sent to MyChart, which I will do.

## 2024-09-11 ENCOUNTER — Telehealth: Payer: Self-pay

## 2024-09-11 NOTE — Telephone Encounter (Signed)
 Patient called in states that she is feeling well and does not wish to proceed with Lithotripsy at this time. Patient states that she is wanting to see Dr. Francisca in December to recheck to make sure. I have cancelled lithotripsy previously scheduled for 10/10/2024

## 2024-10-03 ENCOUNTER — Ambulatory Visit: Payer: Self-pay | Admitting: Cardiology

## 2024-10-03 ENCOUNTER — Ambulatory Visit (INDEPENDENT_AMBULATORY_CARE_PROVIDER_SITE_OTHER): Admitting: Cardiology

## 2024-10-03 ENCOUNTER — Encounter: Payer: Self-pay | Admitting: Cardiology

## 2024-10-03 VITALS — BP 142/84 | HR 78 | Ht 62.0 in | Wt 210.2 lb

## 2024-10-03 DIAGNOSIS — Z713 Dietary counseling and surveillance: Secondary | ICD-10-CM | POA: Diagnosis not present

## 2024-10-03 DIAGNOSIS — E66812 Obesity, class 2: Secondary | ICD-10-CM | POA: Diagnosis not present

## 2024-10-03 DIAGNOSIS — R7303 Prediabetes: Secondary | ICD-10-CM

## 2024-10-03 DIAGNOSIS — Z6839 Body mass index (BMI) 39.0-39.9, adult: Secondary | ICD-10-CM | POA: Diagnosis not present

## 2024-10-03 DIAGNOSIS — R350 Frequency of micturition: Secondary | ICD-10-CM | POA: Diagnosis not present

## 2024-10-03 DIAGNOSIS — I10 Essential (primary) hypertension: Secondary | ICD-10-CM

## 2024-10-03 DIAGNOSIS — N3000 Acute cystitis without hematuria: Secondary | ICD-10-CM | POA: Diagnosis not present

## 2024-10-03 LAB — POCT URINALYSIS DIPSTICK
Bilirubin, UA: NEGATIVE
Blood, UA: POSITIVE
Glucose, UA: NEGATIVE
Ketones, UA: NEGATIVE
Nitrite, UA: NEGATIVE
Protein, UA: NEGATIVE
Spec Grav, UA: 1.02 (ref 1.010–1.025)
Urobilinogen, UA: 0.2 U/dL
pH, UA: 6 (ref 5.0–8.0)

## 2024-10-03 MED ORDER — WEGOVY 0.25 MG/0.5ML ~~LOC~~ SOAJ: 0.2500 mg | SUBCUTANEOUS | 3 refills | Status: AC

## 2024-10-03 MED ORDER — NITROFURANTOIN MONOHYD MACRO 100 MG PO CAPS: 100.0000 mg | ORAL_CAPSULE | Freq: Two times a day (BID) | ORAL | 0 refills | Status: AC

## 2024-10-03 NOTE — Progress Notes (Unsigned)
 Established Patient Office Visit  Subjective:  Patient ID: Bethany Austin, female    DOB: Oct 12, 1946  Age: 78 y.o. MRN: [id number]  Chief Complaint  Patient presents with   Follow-up    4 week follow up    Patient in office for 4 week follow up. Patient did not start on Wegovy  as prescribed at previous visit due to insurance. Will resend Wegovy  today to initiate a prior authorization.  Patient complaining of urinary frequency, urgency. UA abnormal, Will send for culture. Will treat with Macrobid .  Continue all other medications.   Urinary Tract Infection  This is a new problem. The current episode started in the past 7 days. The problem occurs every urination. Associated symptoms include frequency, hesitancy and urgency. She has tried nothing for the symptoms. The treatment provided no relief.    No other concerns at this time.   Past Medical History:  Diagnosis Date   Hypercholesteremia    Hypertension    Hyperthyroidism    Urinary tract infection     Past Surgical History:  Procedure Laterality Date   ABDOMINAL HYSTERECTOMY     BREAST EXCISIONAL BIOPSY Right 1975   negative   BREAST EXCISIONAL BIOPSY Right 2005   negative   COLONOSCOPY WITH PROPOFOL  N/A 01/06/2021   Procedure: COLONOSCOPY WITH PROPOFOL ;  Surgeon: Janalyn Keene NOVAK, MD;  Location: [facility] ENDOSCOPY;  Service: Endoscopy;  Laterality: N/A;    Social History   Socioeconomic History   Marital status: Married    Spouse name: Not on file   Number of children: Not on file   Years of education: Not on file   Highest education level: Not on file  Occupational History   Not on file  Tobacco Use   Smoking status: Never   Smokeless tobacco: Never  Vaping Use   Vaping status: Never Used  Substance and Sexual Activity   Alcohol use: Never   Drug use: Never   Sexual activity: Not Currently    Birth control/protection: Post-menopausal  Other Topics Concern   Not on file  Social History Narrative    Not on file   Social Drivers of Health   Tobacco Use: Low Risk (10/03/2024)   Patient History    Smoking Tobacco Use: Never    Smokeless Tobacco Use: Never    Passive Exposure: Not on file  Financial Resource Strain: Medium Risk (08/08/2024)   Received from [facility]   Overall Financial Resource Strain (CARDIA)    Difficulty of Paying Living Expenses: Somewhat hard  Food Insecurity: Food Insecurity Present (08/08/2024)   Received from [facility]   Epic    Within the past 12 months, you worried that your food would run out before you got the money to buy more.: Sometimes true    Within the past 12 months, the food you bought just didn't last and you didn't have money to get more.: Sometimes true  Transportation Needs: No Transportation Needs (08/08/2024)   Received from [facility] - Transportation    In the past 12 months, has lack of transportation kept you from medical appointments or from getting medications?: No    Lack of Transportation (Non-Medical): No  Physical Activity: Not on file  Stress: Not on file  Social Connections: Not on file  Intimate Partner Violence: Not on file  Depression (PHQ2-9): Low Risk (06/04/2024)   Depression (PHQ2-9)    PHQ-2 Score: 2  Alcohol Screen: Not on file  Housing: Low Risk  (08/08/2024)   Received from [facility]   Epic    In the last 12 months, was there a time when you were not able to pay the mortgage or rent on time?: No    In the past 12 months, how many times have you moved where you were living?: 0    At any time in the past 12 months, were you homeless or living in a shelter (including now)?: No  Utilities: Not At Risk (08/08/2024)   Received from [facility]   Epic    In the past 12 months has the electric, gas, oil, or water company threatened to shut off services in your home?: No  Health Literacy: Not on file    Family  History  Problem Relation Age of Onset   Breast cancer Neg Hx     Allergies[1]  Show/hide medication list[2]  Review of Systems  Constitutional: Negative.   HENT: Negative.    Eyes: Negative.   Respiratory: Negative.  Negative for shortness of breath.   Cardiovascular: Negative.  Negative for chest pain.  Gastrointestinal: Negative.  Negative for abdominal pain, constipation and diarrhea.  Genitourinary:  Positive for frequency, hesitancy and urgency.  Musculoskeletal:  Negative for joint pain and myalgias.  Skin: Negative.   Neurological: Negative.  Negative for dizziness and headaches.  Endo/Heme/Allergies: Negative.   All other systems reviewed and are negative.      Objective:   BP (!) 142/84   Pulse 78   Ht 5' 2 (1.575 m)   Wt 210 lb 3.2 oz (95.3 kg)   SpO2 98%   BMI 38.45 kg/m   Vitals:   10/03/24 1503  BP: (!) 142/84  Pulse: 78  Height: 5' 2 (1.575 m)  Weight: 210 lb 3.2 oz (95.3 kg)  SpO2: 98%  BMI (Calculated): 38.44    Physical Exam Vitals and nursing note reviewed.  Constitutional:      Appearance: Normal appearance. She is normal weight.  HENT:     Head: Normocephalic and atraumatic.     Nose: Nose normal.     Mouth/Throat:     Mouth: Mucous membranes are moist.  Eyes:     Extraocular Movements: Extraocular movements intact.     Conjunctiva/sclera: Conjunctivae normal.     Pupils: Pupils are equal, round, and reactive to light.  Cardiovascular:     Rate and Rhythm: Normal rate and regular rhythm.     Pulses: Normal pulses.     Heart sounds: Normal heart sounds.  Pulmonary:     Effort: Pulmonary effort is normal.     Breath sounds: Normal breath sounds.  Abdominal:     General: Abdomen is flat. Bowel sounds are normal.     Palpations: Abdomen is soft.  Musculoskeletal:        General: Normal range of motion.     Cervical back: Normal range of motion.  Skin:    General: Skin is warm and dry.  Neurological:     General: No focal  deficit present.     Mental Status: She is alert and oriented to person, place, and time.  Psychiatric:        Mood and Affect: Mood normal.        Behavior: Behavior normal.        Thought Content: Thought content normal.        Judgment: Judgment normal.      Results for orders placed or performed  in visit on 10/03/24  POCT urinalysis dipstick  Result Value Ref Range   Color, UA     Clarity, UA     Glucose, UA Negative Negative   Bilirubin, UA Negative    Ketones, UA Negative    Spec Grav, UA 1.020 1.010 - 1.025   Blood, UA Positive    pH, UA 6.0 5.0 - 8.0   Protein, UA Negative Negative   Urobilinogen, UA 0.2 0.2 or 1.0 E.U./dL   Nitrite, UA Negative    Leukocytes, UA Moderate (2+) (A) Negative   Appearance     Odor      Recent Results (from the past 2160 hours)  POCT URINE DIPSTICK     Status: Abnormal   Collection Time: 08/04/24  3:15 PM  Result Value Ref Range   Color, UA yellow yellow   Clarity, UA cloudy (A) clear   Glucose, UA =100 (A) negative mg/dL   Bilirubin, UA negative negative   Ketones, POC UA negative negative mg/dL   Spec Grav, UA 8.974 8.989 - 1.025   Blood, UA trace-intact (A) negative   pH, UA 5.5 5.0 - 8.0   POC PROTEIN,UA =30 (A) negative, trace   Urobilinogen, UA 2.0 (A) 0.2 or 1.0 E.U./dL   Nitrite, UA Positive (A) Negative   Leukocytes, UA Large (3+) (A) Negative  Urine Culture     Status: None   Collection Time: 08/04/24  3:16 PM   Specimen: Urine, Clean Catch  Result Value Ref Range   Specimen Description URINE, CLEAN CATCH    Special Requests NONE    Culture      NO GROWTH Performed at [facility], [address], [place], [place] [address]    Report Status 08/05/2024 FINAL   Urinalysis, Complete     Status: Abnormal   Collection Time: 08/28/24  3:18 PM  Result Value Ref Range   Specific Gravity, UA 1.030 1.005 - 1.030    Comment:      >=1.030   pH, UA 6.0 5.0 - 7.5   Color, UA Yellow Yellow   Appearance Ur Clear  Clear   Leukocytes,UA Negative Negative   Protein,UA Negative Negative/Trace   Glucose, UA Negative Negative   Ketones, UA Negative Negative   RBC, UA 1+ (A) Negative   Bilirubin, UA Negative Negative   Urobilinogen, Ur 0.2 0.2 - 1.0 mg/dL   Nitrite, UA Negative Negative   Microscopic Examination See below:     Comment: Microscopic was indicated and was performed.  Microscopic Examination     Status: Abnormal   Collection Time: 08/28/24  3:18 PM   Urine  Result Value Ref Range   WBC, UA 0-5 0 - 5 /hpf   RBC, Urine 3-10 (A) 0 - 2 /hpf   Epithelial Cells (non renal) 0-10 0 - 10 /hpf   Bacteria, UA None seen None seen/Few  CULTURE, URINE COMPREHENSIVE     Status: None   Collection Time: 08/28/24  3:53 PM   Specimen: Urine   UR  Result Value Ref Range   Urine Culture, Comprehensive Final report    Organism ID, Bacteria Comment     Comment: Mixed urogenital flora 10,000-25,000 colony forming units per mL   Hemoglobin A1c     Status: Abnormal   Collection Time: 09/04/24  3:01 PM  Result Value Ref Range   Hgb A1c MFr Bld 5.9 (H) 4.8 - 5.6 %    Comment:  Prediabetes: 5.7 - 6.4          Diabetes: >6.4          Glycemic control for adults with diabetes: <7.0    Est. average glucose Bld gHb Est-mCnc 123 mg/dL  TSH     Status: None   Collection Time: 09/04/24  3:01 PM  Result Value Ref Range   TSH 0.726 0.450 - 4.500 uIU/mL  Lipid Profile     Status: None   Collection Time: 09/04/24  3:01 PM  Result Value Ref Range   Cholesterol, Total 167 100 - 199 mg/dL   Triglycerides 82 0 - 149 mg/dL   HDL 63 >60 mg/dL   VLDL Cholesterol Cal 15 5 - 40 mg/dL   LDL Chol Calc (NIH) 89 0 - 99 mg/dL   Chol/HDL Ratio 2.7 0.0 - 4.4 ratio    Comment:                                   T. Chol/HDL Ratio                                             Men  Women                               1/2 Avg.Risk  3.4    3.3                                   Avg.Risk  5.0    4.4                                 2X Avg.Risk  9.6    7.1                                3X Avg.Risk 23.4   11.0   CMP14+EGFR     Status: Abnormal   Collection Time: 09/04/24  3:01 PM  Result Value Ref Range   Glucose 114 (H) 70 - 99 mg/dL   BUN 16 8 - 27 mg/dL   Creatinine, Ser 9.11 0.57 - 1.00 mg/dL   eGFR 68 >40 fO/fpw/8.26   BUN/Creatinine Ratio 18 12 - 28   Sodium 140 134 - 144 mmol/L   Potassium 4.0 3.5 - 5.2 mmol/L   Chloride 101 96 - 106 mmol/L   CO2 26 20 - 29 mmol/L   Calcium  9.2 8.7 - 10.3 mg/dL   Total Protein 6.6 6.0 - 8.5 g/dL   Albumin 4.0 3.8 - 4.8 g/dL   Globulin, Total 2.6 1.5 - 4.5 g/dL   Bilirubin Total <9.7 0.0 - 1.2 mg/dL   Alkaline Phosphatase 96 49 - 135 IU/L   AST 11 0 - 40 IU/L   ALT 9 0 - 32 IU/L  POCT urinalysis dipstick     Status: Abnormal   Collection Time: 10/03/24  3:23 PM  Result Value Ref Range   Color, UA     Clarity, UA     Glucose, UA Negative Negative   Bilirubin, UA Negative  Ketones, UA Negative    Spec Grav, UA 1.020 1.010 - 1.025   Blood, UA Positive    pH, UA 6.0 5.0 - 8.0   Protein, UA Negative Negative   Urobilinogen, UA 0.2 0.2 or 1.0 E.U./dL   Nitrite, UA Negative    Leukocytes, UA Moderate (2+) (A) Negative   Appearance     Odor        Assessment & Plan:  Wegovy  Urine culture Macrobid   Problem List Items Addressed This Visit       Cardiovascular and Mediastinum   Primary hypertension     Genitourinary   Acute cystitis without hematuria - Primary     Other   Weight loss counseling, encounter for   Class 2 severe obesity due to excess calories with serious comorbidity and body mass index (BMI) of 39.0 to 39.9 in adult   Relevant Medications   semaglutide -weight management (WEGOVY ) 0.25 MG/0.5ML SOAJ SQ injection   Urinary frequency   Relevant Orders   Urine Culture   POCT urinalysis dipstick (Completed)    Return in about 4 weeks (around 10/31/2024).   Total time spent: 25 minutes. This time includes review of previous notes  and results and patient face to face interaction during today's visit.    Jeoffrey Pollen, NP  10/03/2024   This document may have been prepared by Dragon Voice Recognition software and as such may include unintentional dictation errors.      [1] No Known Allergies [2]  Outpatient Medications Prior to Visit  Medication Sig   acetaminophen (TYLENOL) 325 MG tablet Take 650 mg by mouth every 6 (six) hours as needed.   aspirin 81 MG chewable tablet Chew by mouth.   atorvastatin  (LIPITOR) 10 MG tablet Take 1 tablet (10 mg total) by mouth daily.   Calcium  Carbonate-Vit D-Min (CALTRATE 600+D PLUS MINERALS) 600-800 MG-UNIT TABS Take by mouth daily.   estradiol  (ESTRACE ) 0.01 % CREA vaginal cream Estrogen Cream Instructions Discard applicator Apply pea sized amount to tip of finger to urethra before bed every night for 1 week the use Monday, Wednesday and Friday. Wash hands well after application.   levothyroxine  (SYNTHROID ) 50 MCG tablet Take 1 tablet (50 mcg total) by mouth daily before breakfast.   lisinopril -hydrochlorothiazide  (ZESTORETIC ) 20-25 MG tablet Take 1 tablet by mouth daily.   Multiple Vitamins-Minerals (MULTIVITAMIN WOMEN 50+ PO) Take by mouth daily.   [DISCONTINUED] fexofenadine -pseudoephedrine (ALLEGRA-D) 60-120 MG 12 hr tablet Take 1 tablet by mouth 2 (two) times daily. (Patient not taking: Reported on 10/03/2024)   [DISCONTINUED] fluconazole  (DIFLUCAN ) 150 MG tablet Take 1 tablet (150 mg total) by mouth every three (3) days as needed for up to 2 doses. (Patient not taking: Reported on 10/03/2024)   [DISCONTINUED] semaglutide -weight management (WEGOVY ) 0.25 MG/0.5ML SOAJ SQ injection Inject 0.25 mg into the skin once a week. (Patient not taking: Reported on 10/03/2024)   No facility-administered medications prior to visit.

## 2024-10-03 NOTE — Progress Notes (Signed)
 Vm box not set up

## 2024-10-03 NOTE — Progress Notes (Signed)
 Patient informed.

## 2024-10-04 DIAGNOSIS — R350 Frequency of micturition: Secondary | ICD-10-CM | POA: Insufficient documentation

## 2024-10-04 DIAGNOSIS — N3000 Acute cystitis without hematuria: Secondary | ICD-10-CM | POA: Insufficient documentation

## 2024-10-05 LAB — URINE CULTURE

## 2024-10-09 ENCOUNTER — Ambulatory Visit: Admitting: Urology

## 2024-10-09 VITALS — BP 134/76 | HR 85 | Wt 204.0 lb

## 2024-10-09 DIAGNOSIS — N200 Calculus of kidney: Secondary | ICD-10-CM

## 2024-10-09 DIAGNOSIS — N958 Other specified menopausal and perimenopausal disorders: Secondary | ICD-10-CM | POA: Diagnosis not present

## 2024-10-09 DIAGNOSIS — R300 Dysuria: Secondary | ICD-10-CM | POA: Diagnosis not present

## 2024-10-09 DIAGNOSIS — N390 Urinary tract infection, site not specified: Secondary | ICD-10-CM

## 2024-10-09 NOTE — Progress Notes (Signed)
° °  10/09/2024 1:27 PM   Bethany Austin 02/21/1946 [id number]  Reason for visit: Follow up recurrent UTIs, right renal stone  History: Initial visit July 2024 for recurrent UTIs, resolved after starting estradiol  cream KUB with 1 cm right upper pole stone and she opted for surveillance Recurrent infections after stopping estradiol  cream Was seen in November 2025 and she desired shockwave lithotripsy for or right sided renal stone at that time to potentially improve recurrent infections-however canceled that surgery  Physical Exam: BP 134/76 (BP Location: Left Arm, Patient Position: Sitting, Cuff Size: Large)   Pulse 85   Wt 204 lb (92.5 kg)   SpO2 94%   BMI 37.31 kg/m   Imaging/labs: Urine culture 12/11 <25kmixed flora  Today: Reports she was recently diagnosed with UTI, though culture shows only <25k mixed flora and was started on nitrofurantoin .  Her primary complaint is low back pain.  She denies any urinary symptoms today Did not restart the topical estrogen cream as this was not affordable even through [facility]  Plan:   Recurrent UTI: Again recommended restarting topical estrogen cream, we worked with [facility] to find her a better price($20), discussed that her nonobstructive right renal stone could be playing a role, but surveillance is very reasonable and would recommend starting with cranberry tablets and topical estrogen cream We discussed the difference between true UTI and asymptomatic bacteriuria, reassurance provided regarding negative culture.  Unfortunately I think every time she has a urinalysis done if there are any abnormalities she is told she has an infection even when cultures are negative and given antibiotics for her low back pain symptoms that are unlikely to be UTI related Nephrolithiasis: She defers treatment and prefers surveillance for nonobstructive 1 cm right upper pole stone If worsening urinary symptoms despite negative cultures could consider  cystoscopy to rule out any other bladder pathology, but CT was benign January 2025 She defers further stone treatment at this time Call with urine culture results, RTC 6 months KUB prior   Bethany JAYSON Burnet, MD  [facility] [address] [place], [place] [address] [phone]

## 2024-10-10 ENCOUNTER — Ambulatory Visit: Admit: 2024-10-10 | Admitting: Urology

## 2024-10-10 ENCOUNTER — Telehealth: Payer: Self-pay

## 2024-10-10 LAB — URINALYSIS, COMPLETE
Bilirubin, UA: NEGATIVE
Glucose, UA: NEGATIVE
Ketones, UA: NEGATIVE
Nitrite, UA: NEGATIVE
Protein,UA: NEGATIVE
Specific Gravity, UA: 1.01 (ref 1.005–1.030)
Urobilinogen, Ur: 0.2 mg/dL (ref 0.2–1.0)
pH, UA: 6 (ref 5.0–7.5)

## 2024-10-10 LAB — MICROSCOPIC EXAMINATION: Epithelial Cells (non renal): >10 /HPF — AB (ref 0–10)

## 2024-10-10 SURGERY — LITHOTRIPSY, ESWL
Anesthesia: Moderate Sedation | Laterality: Right

## 2024-10-10 MED ORDER — ESTRADIOL 0.01 % VA CREA: TOPICAL_CREAM | VAGINAL | 12 refills | Status: AC

## 2024-10-10 NOTE — Addendum Note (Signed)
 Addended byBETHA CORIE PLATER on: 10/10/2024 03:52 PM   Modules accepted: Orders

## 2024-10-10 NOTE — Telephone Encounter (Signed)
 Pt called the triage line stating she wanted to know her urine culture results. Pt was advise that the results were not back yet and that they take 3-5 days to come back. Pt also stated that [facility] to [facility] cream but the pharmacist told her the Rx was never sent. I went ahead and sent it to [facility]. Pt aware.

## 2024-10-12 LAB — CULTURE, URINE COMPREHENSIVE

## 2024-10-14 ENCOUNTER — Ambulatory Visit: Payer: Self-pay | Admitting: Urology

## 2024-10-14 DIAGNOSIS — Z2239 Carrier of other specified bacterial diseases: Secondary | ICD-10-CM

## 2024-10-14 DIAGNOSIS — A493 Mycoplasma infection, unspecified site: Secondary | ICD-10-CM

## 2024-10-14 NOTE — Telephone Encounter (Signed)
 Pt called again and left message asking about urine culture results.

## 2024-10-15 LAB — MYCOPLASMA / UREAPLASMA CULTURE: Ureaplasma urealyticum: POSITIVE — AB

## 2024-10-15 MED ORDER — DOXYCYCLINE HYCLATE 100 MG PO CAPS: 100.0000 mg | ORAL_CAPSULE | Freq: Two times a day (BID) | ORAL | 0 refills | Status: AC

## 2024-10-15 NOTE — Telephone Encounter (Signed)
 RX sent. Pt informed. Pt not sexually active, husband recently deceased.

## 2024-10-21 ENCOUNTER — Telehealth: Payer: Self-pay

## 2024-10-21 NOTE — Telephone Encounter (Signed)
 Pt states her head is foggy and has a cold and is asking what she should take. Please advise.

## 2024-10-22 NOTE — Telephone Encounter (Signed)
 Pt informed

## 2025-04-09 ENCOUNTER — Ambulatory Visit: Admitting: Urology
